# Patient Record
Sex: Female | Born: 1990 | Race: White | Hispanic: No | Marital: Married | Smoking: Never smoker
Health system: Southern US, Community
[De-identification: ages and names within clinical notes are randomized; demographics above are authoritative.]

## PROBLEM LIST (undated history)

## (undated) DIAGNOSIS — Z789 Other specified health status: Secondary | ICD-10-CM

## (undated) DIAGNOSIS — Z309 Encounter for contraceptive management, unspecified: Principal | ICD-10-CM

## (undated) HISTORY — PX: TONSILLECTOMY AND ADENOIDECTOMY: SHX28

## (undated) HISTORY — DX: Encounter for contraceptive management, unspecified: Z30.9

## (undated) HISTORY — DX: Other specified health status: Z78.9

---
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
## 2013-06-25 ENCOUNTER — Encounter: Payer: Self-pay | Admitting: Adult Health

## 2013-06-25 ENCOUNTER — Ambulatory Visit (INDEPENDENT_AMBULATORY_CARE_PROVIDER_SITE_OTHER): Payer: 59 | Admitting: Adult Health

## 2013-06-25 VITALS — BP 130/80 | Ht 66.0 in | Wt 174.0 lb

## 2013-06-25 DIAGNOSIS — Z3049 Encounter for surveillance of other contraceptives: Secondary | ICD-10-CM

## 2013-06-25 DIAGNOSIS — Z3041 Encounter for surveillance of contraceptive pills: Secondary | ICD-10-CM | POA: Insufficient documentation

## 2013-06-25 DIAGNOSIS — Z309 Encounter for contraceptive management, unspecified: Secondary | ICD-10-CM

## 2013-06-25 HISTORY — DX: Encounter for contraceptive management, unspecified: Z30.9

## 2013-06-25 MED ORDER — NORGESTIMATE-ETH ESTRADIOL 0.25-35 MG-MCG PO TABS: 1.0000 | ORAL_TABLET | Freq: Every day | ORAL | Status: DC

## 2013-06-25 NOTE — Patient Instructions (Addendum)
Continue sprintec Pap in February Call prn

## 2013-06-25 NOTE — Progress Notes (Signed)
Subjective:     Patient ID: Emily Carpenter, female   DOB: Feb 23, 1991, 22 y.o.   MRN: [id number]  HPI Emily Carpenter is a 22 year old white female in to refill her pills.She is taking sprintec and has done well, has 3 day periods and no cramps.did have a spot of blood one day when not her period.Going to nursing school.Has had sex no problems.   Review of Systems See HPI Reviewed past medical,surgical, social and family history. Reviewed medications and allergies.      Objective:   Physical Exam BP 130/80  Ht 5\' 6"  (1.676 m)  Wt 174 lb (78.926 kg)  BMI 28.1 kg/m2  LMP 06/04/2013   Talk only, will continue the sprintec Assessment:     Contraceptive management    Plan:     Rx sprintec disp 1 pack take 1 daily with 11 refills Return in 5 months for pap and physical

## 2013-11-21 ENCOUNTER — Other Ambulatory Visit: Payer: 59 | Admitting: Adult Health

## 2013-11-28 ENCOUNTER — Encounter: Payer: Self-pay | Admitting: Adult Health

## 2013-11-28 ENCOUNTER — Encounter (INDEPENDENT_AMBULATORY_CARE_PROVIDER_SITE_OTHER): Payer: Self-pay

## 2013-11-28 ENCOUNTER — Ambulatory Visit (INDEPENDENT_AMBULATORY_CARE_PROVIDER_SITE_OTHER): Payer: 59 | Admitting: Adult Health

## 2013-11-28 ENCOUNTER — Other Ambulatory Visit (HOSPITAL_COMMUNITY)
Admission: RE | Admit: 2013-11-28 | Discharge: 2013-11-28 | Disposition: A | Discharge status: Home / Self Care | Payer: 59 | Source: Ambulatory Visit | Attending: Obstetrics and Gynecology | Admitting: Obstetrics and Gynecology

## 2013-11-28 VITALS — BP 138/78 | HR 72 | Ht 66.0 in | Wt 181.5 lb

## 2013-11-28 DIAGNOSIS — Z01419 Encounter for gynecological examination (general) (routine) without abnormal findings: Secondary | ICD-10-CM | POA: Insufficient documentation

## 2013-11-28 DIAGNOSIS — Z309 Encounter for contraceptive management, unspecified: Secondary | ICD-10-CM

## 2013-11-28 NOTE — Progress Notes (Signed)
Patient ID: Emily DeemMeghan L Carpenter, female   DOB: 11-Feb-1991, 23 y.o.   MRN: [id number] History of Present Illness: Emily SpruceMeghan is a 23 year old white female in for her first pap and physical.No complaints.Happy with her OCs.Mom with pt.   Current Medications, Allergies, Past Medical History, Past Surgical History, Family History and Social History were reviewed in Emily Carpenter electronic medical record.     Review of Systems: Patient denies any headaches, blurred vision, shortness of breath, chest pain, abdominal pain, problems with bowel movements, urination, or intercourse. No joint pain or mood swings, in nursing school.    Physical Exam:BP 138/78  Pulse 72  Ht 5\' 6"  (1.676 m)  Wt 181 lb 8 oz (82.328 kg)  BMI 29.31 kg/m2  LMP 11/19/2013 General:  Well developed, well nourished, no acute distress Skin:  Warm and dry Neck:  Midline trachea, normal thyroid Lungs; Clear to auscultation bilaterally Breast:  No dominant palpable mass, retraction, or nipple discharge Cardiovascular: Regular rate and rhythm Abdomen:  Soft, non tender, no hepatosplenomegaly Pelvic:  External genitalia is normal in appearance.  The vagina is normal in appearance. The cervix is nulliparous, pap performed.Marland Kitchen.  Uterus is felt to be normal size, shape, and contour.  No                adnexal masses or tenderness noted. Extremities:  No swelling or varicosities noted Psych:  No mood changes, alert and cooperative ,seems happy   Impression: Yearly gyn exam-first pap Contraceptive management    Plan: Pap and physical in 2 years Continue sprintec Call prn problems

## 2013-11-28 NOTE — Patient Instructions (Signed)
Physical and pap in 2 years Call prn

## 2014-05-27 ENCOUNTER — Other Ambulatory Visit: Payer: Self-pay | Admitting: Adult Health

## 2014-11-18 ENCOUNTER — Other Ambulatory Visit: Payer: Self-pay | Admitting: Adult Health

## 2014-12-02 ENCOUNTER — Telehealth: Payer: Self-pay | Admitting: *Deleted

## 2014-12-02 NOTE — Telephone Encounter (Signed)
Left message x 1. JSY 

## 2014-12-02 NOTE — Telephone Encounter (Signed)
Left message x 2. JSY 

## 2014-12-03 NOTE — Telephone Encounter (Signed)
Pt Mother, Misty StanleyLisa, informed Sprintec e-scribed to [facility] on 11/19/2014.

## 2015-10-27 ENCOUNTER — Encounter: Payer: Self-pay | Admitting: Adult Health

## 2015-10-27 ENCOUNTER — Other Ambulatory Visit (HOSPITAL_COMMUNITY)
Admission: RE | Admit: 2015-10-27 | Discharge: 2015-10-27 | Disposition: A | Discharge status: Home / Self Care | Payer: PRIVATE HEALTH INSURANCE | Source: Ambulatory Visit | Attending: Obstetrics and Gynecology | Admitting: Obstetrics and Gynecology

## 2015-10-27 ENCOUNTER — Ambulatory Visit (INDEPENDENT_AMBULATORY_CARE_PROVIDER_SITE_OTHER): Payer: PRIVATE HEALTH INSURANCE | Admitting: Adult Health

## 2015-10-27 VITALS — BP 130/60 | HR 82 | Ht 66.25 in | Wt 187.0 lb

## 2015-10-27 DIAGNOSIS — Z01419 Encounter for gynecological examination (general) (routine) without abnormal findings: Secondary | ICD-10-CM | POA: Diagnosis not present

## 2015-10-27 DIAGNOSIS — Z3041 Encounter for surveillance of contraceptive pills: Secondary | ICD-10-CM

## 2015-10-27 MED ORDER — NORGESTIMATE-ETH ESTRADIOL 0.25-35 MG-MCG PO TABS: 1.0000 | ORAL_TABLET | Freq: Every day | ORAL | Status: DC

## 2015-10-27 NOTE — Patient Instructions (Signed)
Physical in 1 year, pap in 3 if normal Continue OCs 

## 2015-10-27 NOTE — Progress Notes (Signed)
Patient ID: Emily Carpenter, female   DOB: 06-29-1991, 25 y.o.   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   Current Medications, Allergies, Past Medical History, Past Surgical History, Family History and Social History were reviewed in [employer] record.     Review of Systems: Patient denies any headaches, hearing loss, fatigue, blurred vision, shortness of breath, chest pain, abdominal pain, problems with bowel movements, urination, or intercourse. No joint pain or mood swings.    Physical Exam: BP 130/60 mmHg  Pulse 82  Ht 5' 6.25" (1.683 m)  Wt 187 lb (84.823 kg)  BMI 29.95 kg/m2  LMP 10/18/2015 General:  Well developed, well nourished, no acute distress Skin:  Warm and dry Neck:  Midline trachea, normal thyroid, good ROM, no lymphadenopathy Lungs; Clear to auscultation bilaterally Breast:  No dominant palpable mass, retraction, or nipple discharge Cardiovascular: Regular rate and rhythm Abdomen:  Soft, non tender, no hepatosplenomegaly Pelvic:  External genitalia is normal in appearance, no lesions.  The vagina is normal in appearance. Urethra has no lesions or masses. The cervix is tiny and everted at os, pap performed.Marland Kitchen  Uterus is felt to be normal size, shape, and contour.  No adnexal masses or tenderness noted.Bladder is non tender, no masses felt. Extremities/musculoskeletal:  No swelling or varicosities noted, no clubbing or cyanosis Psych:  No mood changes, alert and cooperative,seems happy She is not ready to get pregnant, but discussed when decides to, start prenatal vitamins with folic acid and be off OCs for 3 months to tract cycle and it could take 6-18 months of trying.  Impression: Well woman gyn exam and pap Contraceptive management   Plan: Refilled sprintec for 1  year Physical in 1 year, pap in 3 if negative

## 2015-10-28 LAB — CYTOLOGY - PAP

## 2016-09-16 ENCOUNTER — Other Ambulatory Visit: Payer: Self-pay | Admitting: Adult Health

## 2016-10-07 ENCOUNTER — Telehealth: Payer: Self-pay | Admitting: Adult Health

## 2016-10-11 MED ORDER — NORGESTIMATE-ETH ESTRADIOL 0.25-35 MG-MCG PO TABS: 1.0000 | ORAL_TABLET | Freq: Every day | ORAL | 3 refills | Status: DC

## 2016-10-11 NOTE — Telephone Encounter (Signed)
Refill sent.

## 2016-10-28 ENCOUNTER — Encounter: Payer: Self-pay | Admitting: Adult Health

## 2016-10-28 ENCOUNTER — Ambulatory Visit (INDEPENDENT_AMBULATORY_CARE_PROVIDER_SITE_OTHER): Payer: PRIVATE HEALTH INSURANCE | Admitting: Adult Health

## 2016-10-28 VITALS — BP 130/62 | HR 72 | Ht 66.25 in | Wt 210.0 lb

## 2016-10-28 DIAGNOSIS — Z01419 Encounter for gynecological examination (general) (routine) without abnormal findings: Secondary | ICD-10-CM

## 2016-10-28 DIAGNOSIS — Z3041 Encounter for surveillance of contraceptive pills: Secondary | ICD-10-CM

## 2016-10-28 MED ORDER — NORGESTIMATE-ETH ESTRADIOL 0.25-35 MG-MCG PO TABS: 1.0000 | ORAL_TABLET | Freq: Every day | ORAL | 12 refills | Status: DC

## 2016-10-28 NOTE — Progress Notes (Signed)
Patient ID: Emily Carpenter L Schoonmaker, female   DOB: 09/30/1991, 26 y.o.   MRN: [id number] History of Present Illness:  Lindie SpruceMeghan is a 26 year old white female in for well woman gyn exam,she had normal pap 10/27/15. [facility].   Current Medications, Allergies, Past Medical History, Past Surgical History, Family History and Social History were reviewed in [facility] electronic medical record.     Review of Systems:  Patient denies any headaches, hearing loss, fatigue, blurred vision, shortness of breath, chest pain, abdominal pain, problems with bowel movements, urination, or intercourse. No joint pain or mood swings.She is happy with her OCs.   Physical Exam:BP 130/62 (BP Location: Left Arm, Patient Position: Sitting, Cuff Size: Large)   Pulse 72   Ht 5' 6.25" (1.683 m)   Wt 210 lb (95.3 kg)   LMP 10/14/2016 (Approximate)   BMI 33.64 kg/m  General:  Well developed, well nourished, no acute distress Skin:  Warm and dry Neck:  Midline trachea, normal thyroid, good ROM, no lymphadenopathy Lungs; Clear to auscultation bilaterally Breast:  No dominant palpable mass, retraction, or nipple discharge Cardiovascular: Regular rate and rhythm Abdomen:  Soft, non tender, no hepatosplenomegaly Pelvic:  External genitalia is normal in appearance, no lesions.  The vagina is normal in appearance. Urethra has no lesions or masses. The cervix is nulliparous.  Uterus is felt to be normal size, shape, and contour.  No adnexal masses or tenderness noted.Bladder is non tender, no masses felt. Extremities/musculoskeletal:  No swelling or varicosities noted, no clubbing or cyanosis Psych:  No mood changes, alert and cooperative,seems happy PHQ 2 score 0.  Impression:  1. Well woman exam with routine gynecological exam   2. Encounter for surveillance of contraceptive pills      Plan: Meds ordered this encounter  Medications  . norgestimate-ethinyl estradiol (SPRINTEC 28) 0.25-35 MG-MCG tablet   Sig: Take 1 tablet by mouth daily.    Dispense:  28 tablet    Refill:  12    Order Specific Question:   Supervising Provider    Answer:   Lazaro ArmsEURE, LUTHER H [[id number]]  Physical in 1 year, pap in 2020

## 2017-10-31 ENCOUNTER — Other Ambulatory Visit (HOSPITAL_COMMUNITY)
Admission: RE | Admit: 2017-10-31 | Discharge: 2017-10-31 | Disposition: A | Discharge status: Home / Self Care | Payer: Commercial Managed Care - PPO | Source: Ambulatory Visit | Attending: Adult Health | Admitting: Adult Health

## 2017-10-31 ENCOUNTER — Ambulatory Visit (INDEPENDENT_AMBULATORY_CARE_PROVIDER_SITE_OTHER): Payer: Commercial Managed Care - PPO | Admitting: Adult Health

## 2017-10-31 ENCOUNTER — Encounter: Payer: Self-pay | Admitting: Adult Health

## 2017-10-31 VITALS — BP 138/70 | HR 85 | Ht 66.5 in | Wt 219.0 lb

## 2017-10-31 DIAGNOSIS — Z3041 Encounter for surveillance of contraceptive pills: Secondary | ICD-10-CM | POA: Diagnosis not present

## 2017-10-31 DIAGNOSIS — Z319 Encounter for procreative management, unspecified: Secondary | ICD-10-CM | POA: Diagnosis not present

## 2017-10-31 DIAGNOSIS — Z01419 Encounter for gynecological examination (general) (routine) without abnormal findings: Secondary | ICD-10-CM | POA: Diagnosis not present

## 2017-10-31 MED ORDER — OB COMPLETE PETITE 35-5-1-200 MG PO CAPS: ORAL_CAPSULE | ORAL | 12 refills | Status: DC

## 2017-10-31 NOTE — Patient Instructions (Signed)
Preparing for Pregnancy If you are considering becoming pregnant, make an appointment to see your regular health care provider to learn how to prepare for a safe and healthy pregnancy (preconception care). During a preconception care visit, your health care provider will:  Do a complete physical exam, including a Pap test.  Take a complete medical history.  Give you information, answer your questions, and help you resolve problems.  Preconception checklist Medical history  Tell your health care provider about any current or past medical conditions. Your pregnancy or your ability to become pregnant may be affected by chronic conditions, such as diabetes, chronic hypertension, and thyroid problems.  Include your family's medical history as well as your partner's medical history.  Tell your health care provider about any history of STIs (sexually transmitted infections).These can affect your pregnancy. In some cases, they can be passed to your baby. Discuss any concerns that you have about STIs.  If indicated, discuss the benefits of genetic testing. This testing will show whether there are any genetic conditions that may be passed from you or your partner to your baby.  Tell your health care provider about: ? Any problems you have had with conception or pregnancy. ? Any medicines you take. These include vitamins, herbal supplements, and over-the-counter medicines. ? Your history of immunizations. Discuss any vaccinations that you may need.  Diet  Ask your health care provider what to include in a healthy diet that has a balance of nutrients. This is especially important when you are pregnant or preparing to become pregnant.  Ask your health care provider to help you reach a healthy weight before pregnancy. ? If you are overweight, you may be at higher risk for certain complications, such as high blood pressure, diabetes, and preterm birth. ? If you are underweight, you are more likely  to have a baby who has a low birth weight.  Lifestyle, work, and home  Let your health care provider know: ? About any lifestyle habits that you have, such as alcohol use, drug use, or smoking. ? About recreational activities that may put you at risk during pregnancy, such as downhill skiing and certain exercise programs. ? Tell your health care provider about any international travel, especially any travel to places with an active Zika virus outbreak. ? About harmful substances that you may be exposed to at work or at home. These include chemicals, pesticides, radiation, or even litter boxes. ? If you do not feel safe at home.  Mental health  Tell your health care provider about: ? Any history of mental health conditions, including feelings of depression, sadness, or anxiety. ? Any medicines that you take for a mental health condition. These include herbs and supplements.  Home instructions to prepare for pregnancy Lifestyle  Eat a balanced diet. This includes fresh fruits and vegetables, whole grains, lean meats, low-fat dairy products, healthy fats, and foods that are high in fiber. Ask to meet with a nutritionist or registered dietitian for assistance with meal planning and goals.  Get regular exercise. Try to be active for at least 30 minutes a day on most days of the week. Ask your health care provider which activities are safe during pregnancy.  Do not use any products that contain nicotine or tobacco, such as cigarettes and e-cigarettes. If you need help quitting, ask your health care provider.  Do not drink alcohol.  Do not take illegal drugs.  Maintain a healthy weight. Ask your health care provider what weight range is   right for you.  General instructions  Keep an accurate record of your menstrual periods. This makes it easier for your health care provider to determine your baby's due date.  Begin taking prenatal vitamins and folic acid supplements daily as directed by  your health care provider.  Manage any chronic conditions, such as high blood pressure and diabetes, as told by your health care provider. This is important.  How do I know that I am pregnant? You may be pregnant if you have been sexually active and you miss your period. Symptoms of early pregnancy include:  Mild cramping.  Very light vaginal bleeding (spotting).  Feeling unusually tired.  Nausea and vomiting (morning sickness).  If you have any of these symptoms and you suspect that you might be pregnant, you can take a home pregnancy test. These tests check for a hormone in your urine (human chorionic gonadotropin, or hCG). A woman's body begins to make this hormone during early pregnancy. These tests are very accurate. Wait until at least the first day after you miss your period to take one. If the test shows that you are pregnant (you get a positive result), call your health care provider to make an appointment for prenatal care. What should I do if I become pregnant?  Make an appointment with your health care provider as soon as you suspect you are pregnant.  Do not use any products that contain nicotine, such as cigarettes, chewing tobacco, and e-cigarettes. If you need help quitting, ask your health care provider.  Do not drink alcoholic beverages. Alcohol is related to a number of birth defects.  Avoid toxic odors and chemicals.  You may continue to have sexual intercourse if it does not cause pain or other problems, such as vaginal bleeding. This information is not intended to replace advice given to you by your health care provider. Make sure you discuss any questions you have with your health care provider. Document Released: 09/02/2008 Document Revised: 05/18/2016 Document Reviewed: 04/11/2016 Elsevier Interactive Patient Education  2018 Elsevier Inc.  

## 2017-10-31 NOTE — Progress Notes (Signed)
Patient ID: Emily RinkMeghan L Carpenter, female   DOB: 01/29/1991, 27 y.o.   MRN: [id number] History of Present Illness:  Emily Carpenter is a 27 year old white female, married in for well woman gyn exam and pap.She works at [employer].  [facility]   Current Medications, Allergies, Past Medical History, Past Surgical History, Family History and Social History were reviewed in [facility] electronic medical record.     Review of Systems: Patient denies any headaches, hearing loss, fatigue, blurred vision, shortness of breath, chest pain, abdominal pain, problems with bowel movements, urination, or intercourse. No joint pain or mood swings. Thinking of getting pregnant in near future. She has lost 12 lbs and would like to be below 200, when gets pregnant.     Physical Exam:BP 138/70 (BP Location: Left Arm, Patient Position: Sitting, Cuff Size: Large)   Pulse 85   Ht 5' 6.5" (1.689 m)   Wt 219 lb (99.3 kg)   LMP 10/14/2017   BMI 34.82 kg/m  General:  Well developed, well nourished, no acute distress Skin:  Warm and dry Neck:  Midline trachea, normal thyroid, good ROM, no lymphadenopathy Lungs; Clear to auscultation bilaterally Breast:  No dominant palpable mass, retraction, or nipple discharge Cardiovascular: Regular rate and rhythm Abdomen:  Soft, non tender, no hepatosplenomegaly Pelvic:  External genitalia is normal in appearance, no lesions.  The vagina is normal in appearance. Urethra has no lesions or masses. The cervix is smooth,thin prep pap with reflex HPV performed.  Uterus is felt to be normal size, shape, and contour.  No adnexal masses or tenderness noted.Bladder is non tender, no masses felt. Extremities/musculoskeletal:  No swelling or varicosities noted, no clubbing or cyanosis Psych:  No mood changes, alert and cooperative,seems happy PHQ 2 score 0. She may stop OCs in March, discussed timing of sex and to pee before and lay there 30 minutes after.Will rx PNV now.    Impression: 1. Encounter for gynecological examination with Papanicolaou smear of cervix   2. Encounter for surveillance of contraceptive pills   3. Patient desires pregnancy       Plan: Meds ordered this encounter  Medications  . Prenat-FeCbn-FeAspGl-FA-Omega (OB COMPLETE PETITE) 35-5-1-200 MG CAPS    Sig: Take daily    Dispense:  30 capsule    Refill:  12    Order Specific Question:   Supervising Provider    Answer:   Duane LopeEURE, LUTHER H [[id number]]  Continue OCs til ready  Physical in 1 year Pap in 3 if normal

## 2017-11-04 LAB — CYTOLOGY - PAP: DIAGNOSIS: NEGATIVE

## 2017-11-07 ENCOUNTER — Telehealth: Payer: Self-pay | Admitting: *Deleted

## 2017-11-07 NOTE — Telephone Encounter (Signed)
Advised patient to try OTC gummie vitamins or Flintstones since insurance will not cover. Pt verbalized understanding.

## 2018-02-06 ENCOUNTER — Telehealth: Payer: Self-pay | Admitting: Obstetrics & Gynecology

## 2018-02-06 NOTE — Telephone Encounter (Signed)
Spoke with pt. Pt reports having spotting over the weekend and it has gotten worse. + cramping, pain in right side. Cramping has eased up some. No appts available here. Advised pt to see how she does. If pain gets worse or bleeding heavier, go to [facility] for eval. Pt voiced understanding. JSY

## 2018-02-21 ENCOUNTER — Ambulatory Visit: Payer: Commercial Managed Care - PPO | Admitting: Adult Health

## 2018-02-21 ENCOUNTER — Encounter: Payer: Self-pay | Admitting: Adult Health

## 2018-02-21 VITALS — BP 120/60 | HR 97 | Ht 66.2 in | Wt 218.0 lb

## 2018-02-21 DIAGNOSIS — Z3202 Encounter for pregnancy test, result negative: Secondary | ICD-10-CM | POA: Diagnosis not present

## 2018-02-21 DIAGNOSIS — R10813 Right lower quadrant abdominal tenderness: Secondary | ICD-10-CM

## 2018-02-21 DIAGNOSIS — N926 Irregular menstruation, unspecified: Secondary | ICD-10-CM | POA: Diagnosis not present

## 2018-02-21 LAB — POCT URINE PREGNANCY: PREG TEST UR: NEGATIVE

## 2018-02-21 NOTE — Progress Notes (Signed)
  Subjective:     Patient ID: Emily Carpenter, female   DOB: 23-Jan-1991, 27 y.o.   MRN: [id number]  HPI Emily Carpenter is a 27 year old white female, married in for UPT, has missed a period and had 3+HPTs, then had bleeding for last 1.5 weeks and cramps and passed tissue and now pain RLQ, no bleeding now.   Review of Systems +missed period +3HPTs, then had bleeding and passed tissue +RLQ pain Reviewed past medical,surgical, social and family history. Reviewed medications and allergies.     Objective:   Physical Exam BP 120/60 (BP Location: Left Arm, Patient Position: Sitting, Cuff Size: Large)   Pulse 97   Ht 5' 6.2" (1.681 m)   Wt 218 lb (98.9 kg)   LMP 01/05/2018   BMI 34.97 kg/m  UPT negative. Skin warm and dry.Pelvic: external genitalia is normal in appearance no lesions, vagina:scant discharge without odor,urethra has no lesions or masses noted, cervix:smooth, uterus: normal size, shape and contour, non tender, no masses felt, adnexa: no masses,+ mild RLQ tenderness noted,no rebound. Bladder is non tender and no masses felt.Will check labs if [facility] elevated, may get [place].    Assessment:     1. Missed period   2. Right lower quadrant abdominal tenderness without rebound tenderness   3. Pregnancy examination or test, negative result       Plan:     Check QHCG, CBC and ABORH Will talk in am

## 2018-02-22 ENCOUNTER — Telehealth: Payer: Self-pay | Admitting: Adult Health

## 2018-02-22 LAB — ABO/RH: Rh Factor: POSITIVE

## 2018-02-22 LAB — CBC
Hematocrit: 40 % (ref 34.0–46.6)
Hemoglobin: 13.4 g/dL (ref 11.1–15.9)
MCH: 30 pg (ref 26.6–33.0)
MCHC: 33.5 g/dL (ref 31.5–35.7)
MCV: 90 fL (ref 79–97)
PLATELETS: 259 10*3/uL (ref 150–450)
RBC: 4.47 x10E6/uL (ref 3.77–5.28)
RDW: 13.3 % (ref 12.3–15.4)
WBC: 9.4 10*3/uL (ref 3.4–10.8)

## 2018-02-22 LAB — BETA HCG QUANT (REF LAB)

## 2018-02-22 NOTE — Telephone Encounter (Signed)
Patient called stating that she had Blood work done yesterday and would like to know the results. Please contact pt

## 2018-02-22 NOTE — Telephone Encounter (Signed)
Pt aware QHCG <1 and blodd type O+, and she says RLQ pain is much better to day. Ok to resume sexual activity

## 2018-04-19 ENCOUNTER — Encounter: Payer: Self-pay | Admitting: Adult Health

## 2018-04-19 ENCOUNTER — Ambulatory Visit: Payer: Commercial Managed Care - PPO | Admitting: Adult Health

## 2018-04-19 VITALS — BP 118/69 | HR 83 | Ht 66.5 in | Wt 222.0 lb

## 2018-04-19 DIAGNOSIS — R110 Nausea: Secondary | ICD-10-CM

## 2018-04-19 DIAGNOSIS — Z3A01 Less than 8 weeks gestation of pregnancy: Secondary | ICD-10-CM | POA: Insufficient documentation

## 2018-04-19 DIAGNOSIS — Z3201 Encounter for pregnancy test, result positive: Secondary | ICD-10-CM | POA: Diagnosis not present

## 2018-04-19 DIAGNOSIS — N926 Irregular menstruation, unspecified: Secondary | ICD-10-CM | POA: Diagnosis not present

## 2018-04-19 DIAGNOSIS — O3680X0 Pregnancy with inconclusive fetal viability, not applicable or unspecified: Secondary | ICD-10-CM | POA: Insufficient documentation

## 2018-04-19 LAB — POCT URINE PREGNANCY: PREG TEST UR: POSITIVE — AB

## 2018-04-19 NOTE — Progress Notes (Signed)
  Subjective:     Patient ID: Emily Carpenter, female   DOB: 1991/10/02, 27 y.o.   MRN: [id number]  HPI Emily Carpenter is a 27 year old white female, married in for UPT.Has missed a period and had 2=HPTs.   Review of Systems +missed period with 2+HPTs +nasuea Reviewed past medical,surgical, social and family history. Reviewed medications and allergies.     Objective:   Physical Exam BP 118/69 (BP Location: Left Arm, Patient Position: Sitting, Cuff Size: Normal)   Pulse 83   Ht 5' 6.5" (1.689 m)   Wt 222 lb (100.7 kg)   LMP 03/04/2018 (Exact Date)   BMI 35.29 kg/m UPT +, about 6+4 weeks by LMP with EDD 12/09/18.Skin warm and dry. Neck: mid line trachea, normal thyroid, good ROM, no lymphadenopathy noted. Lungs: clear to ausculation bilaterally. Cardiovascular: regular rate and rhythm.Abdomen is soft and non tender.    Assessment:     1. Pregnancy test positive   2. Less than [redacted] weeks gestation of pregnancy   3. Encounter to determine fetal viability of pregnancy, single or unspecified fetus       Plan:     Continue PNV Dating US scheduled at [facility] 7/25 at 1:30 pm  Return in 3 weeks for New OB Review handout on First trimester and by [facility]  Eat often

## 2018-04-19 NOTE — Patient Instructions (Signed)
First Trimester of Pregnancy The first trimester of pregnancy is from week 1 until the end of week 13 (months 1 through 3). A week after a sperm fertilizes an egg, the egg will implant on the wall of the uterus. This embryo will begin to develop into a baby. Genes from you and your partner will form the baby. The female genes will determine whether the baby will be a boy or a girl. At 6-8 weeks, the eyes and face will be formed, and the heartbeat can be seen on ultrasound. At the end of 12 weeks, all the baby's organs will be formed. Now that you are pregnant, you will want to do everything you can to have a healthy baby. Two of the most important things are to get good prenatal care and to follow your health care provider's instructions. Prenatal care is all the medical care you receive before the baby's birth. This care will help prevent, find, and treat any problems during the pregnancy and childbirth. Body changes during your first trimester Your body goes through many changes during pregnancy. The changes vary from woman to woman.  You may gain or lose a couple of pounds at first.  You may feel sick to your stomach (nauseous) and you may throw up (vomit). If the vomiting is uncontrollable, call your health care provider.  You may tire easily.  You may develop headaches that can be relieved by medicines. All medicines should be approved by your health care provider.  You may urinate more often. Painful urination may mean you have a bladder infection.  You may develop heartburn as a result of your pregnancy.  You may develop constipation because certain hormones are causing the muscles that push stool through your intestines to slow down.  You may develop hemorrhoids or swollen veins (varicose veins).  Your breasts may begin to grow larger and become tender. Your nipples may stick out more, and the tissue that surrounds them (areola) may become darker.  Your gums may bleed and may be  sensitive to brushing and flossing.  Dark spots or blotches (chloasma, mask of pregnancy) may develop on your face. This will likely fade after the baby is born.  Your menstrual periods will stop.  You may have a loss of appetite.  You may develop cravings for certain kinds of food.  You may have changes in your emotions from day to day, such as being excited to be pregnant or being concerned that something may go wrong with the pregnancy and baby.  You may have more vivid and strange dreams.  You may have changes in your hair. These can include thickening of your hair, rapid growth, and changes in texture. Some women also have hair loss during or after pregnancy, or hair that feels dry or thin. Your hair will most likely return to normal after your baby is born.  What to expect at prenatal visits During a routine prenatal visit:  You will be weighed to make sure you and the baby are growing normally.  Your blood pressure will be taken.  Your abdomen will be measured to track your baby's growth.  The fetal heartbeat will be listened to between weeks 10 and 14 of your pregnancy.  Test results from any previous visits will be discussed.  Your health care provider may ask you:  How you are feeling.  If you are feeling the baby move.  If you have had any abnormal symptoms, such as leaking fluid, bleeding, severe headaches,   or abdominal cramping.  If you are using any tobacco products, including cigarettes, chewing tobacco, and electronic cigarettes.  If you have any questions.  Other tests that may be performed during your first trimester include:  Blood tests to find your blood type and to check for the presence of any previous infections. The tests will also be used to check for low iron levels (anemia) and protein on red blood cells (Rh antibodies). Depending on your risk factors, or if you previously had diabetes during pregnancy, you may have tests to check for high blood  sugar that affects pregnant women (gestational diabetes).  Urine tests to check for infections, diabetes, or protein in the urine.  An ultrasound to confirm the proper growth and development of the baby.  Fetal screens for spinal cord problems (spina bifida) and Down syndrome.  HIV (human immunodeficiency virus) testing. Routine prenatal testing includes screening for HIV, unless you choose not to have this test.  You may need other tests to make sure you and the baby are doing well.  Follow these instructions at home: Medicines  Follow your health care provider's instructions regarding medicine use. Specific medicines may be either safe or unsafe to take during pregnancy.  Take a prenatal vitamin that contains at least 600 micrograms (mcg) of folic acid.  If you develop constipation, try taking a stool softener if your health care provider approves. Eating and drinking  Eat a balanced diet that includes fresh fruits and vegetables, whole grains, good sources of protein such as meat, eggs, or tofu, and low-fat dairy. Your health care provider will help you determine the amount of weight gain that is right for you.  Avoid raw meat and uncooked cheese. These carry germs that can cause birth defects in the baby.  Eating four or five small meals rather than three large meals a day may help relieve nausea and vomiting. If you start to feel nauseous, eating a few soda crackers can be helpful. Drinking liquids between meals, instead of during meals, also seems to help ease nausea and vomiting.  Limit foods that are high in fat and processed sugars, such as fried and sweet foods.  To prevent constipation: ? Eat foods that are high in fiber, such as fresh fruits and vegetables, whole grains, and beans. ? Drink enough fluid to keep your urine clear or pale yellow. Activity  Exercise only as directed by your health care provider. Most women can continue their usual exercise routine during  pregnancy. Try to exercise for 30 minutes at least 5 days a week. Exercising will help you: ? Control your weight. ? Stay in shape. ? Be prepared for labor and delivery.  Experiencing pain or cramping in the lower abdomen or lower back is a good sign that you should stop exercising. Check with your health care provider before continuing with normal exercises.  Try to avoid standing for long periods of time. Move your legs often if you must stand in one place for a long time.  Avoid heavy lifting.  Wear low-heeled shoes and practice good posture.  You may continue to have sex unless your health care provider tells you not to. Relieving pain and discomfort  Wear a good support bra to relieve breast tenderness.  Take warm sitz baths to soothe any pain or discomfort caused by hemorrhoids. Use hemorrhoid cream if your health care provider approves.  Rest with your legs elevated if you have leg cramps or low back pain.  If you develop   varicose veins in your legs, wear support hose. Elevate your feet for 15 minutes, 3-4 times a day. Limit salt in your diet. Prenatal care  Schedule your prenatal visits by the twelfth week of pregnancy. They are usually scheduled monthly at first, then more often in the last 2 months before delivery.  Write down your questions. Take them to your prenatal visits.  Keep all your prenatal visits as told by your health care provider. This is important. Safety  Wear your seat belt at all times when driving.  Make a list of emergency phone numbers, including numbers for family, friends, the hospital, and police and fire departments. General instructions  Ask your health care provider for a referral to a local prenatal education class. Begin classes no later than the beginning of month 6 of your pregnancy.  Ask for help if you have counseling or nutritional needs during pregnancy. Your health care provider can offer advice or refer you to specialists for help  with various needs.  Do not use hot tubs, steam rooms, or saunas.  Do not douche or use tampons or scented sanitary pads.  Do not cross your legs for long periods of time.  Avoid cat litter boxes and soil used by cats. These carry germs that can cause birth defects in the baby and possibly loss of the fetus by miscarriage or stillbirth.  Avoid all smoking, herbs, alcohol, and medicines not prescribed by your health care provider. Chemicals in these products affect the formation and growth of the baby.  Do not use any products that contain nicotine or tobacco, such as cigarettes and e-cigarettes. If you need help quitting, ask your health care provider. You may receive counseling support and other resources to help you quit.  Schedule a dentist appointment. At home, brush your teeth with a soft toothbrush and be gentle when you floss. Contact a health care provider if:  You have dizziness.  You have mild pelvic cramps, pelvic pressure, or nagging pain in the abdominal area.  You have persistent nausea, vomiting, or diarrhea.  You have a bad smelling vaginal discharge.  You have pain when you urinate.  You notice increased swelling in your face, hands, legs, or ankles.  You are exposed to fifth disease or chickenpox.  You are exposed to German measles (rubella) and have never had it. Get help right away if:  You have a fever.  You are leaking fluid from your vagina.  You have spotting or bleeding from your vagina.  You have severe abdominal cramping or pain.  You have rapid weight gain or loss.  You vomit blood or material that looks like coffee grounds.  You develop a severe headache.  You have shortness of breath.  You have any kind of trauma, such as from a fall or a car accident. Summary  The first trimester of pregnancy is from week 1 until the end of week 13 (months 1 through 3).  Your body goes through many changes during pregnancy. The changes vary from  woman to woman.  You will have routine prenatal visits. During those visits, your health care provider will examine you, discuss any test results you may have, and talk with you about how you are feeling. This information is not intended to replace advice given to you by your health care provider. Make sure you discuss any questions you have with your health care provider. Document Released: 09/14/2001 Document Revised: 09/01/2016 Document Reviewed: 09/01/2016 Elsevier Interactive Patient Education  2018 [employer]  

## 2018-04-27 ENCOUNTER — Telehealth: Payer: Self-pay | Admitting: Adult Health

## 2018-04-27 ENCOUNTER — Ambulatory Visit (HOSPITAL_COMMUNITY)
Admission: RE | Admit: 2018-04-27 | Discharge: 2018-04-27 | Disposition: A | Discharge status: Home / Self Care | Payer: Commercial Managed Care - PPO | Source: Ambulatory Visit | Attending: Adult Health | Admitting: Adult Health

## 2018-04-27 DIAGNOSIS — O3680X0 Pregnancy with inconclusive fetal viability, not applicable or unspecified: Secondary | ICD-10-CM | POA: Diagnosis present

## 2018-04-27 DIAGNOSIS — Z3A08 8 weeks gestation of pregnancy: Secondary | ICD-10-CM | POA: Diagnosis not present

## 2018-04-27 NOTE — Telephone Encounter (Signed)
Pt aware of U/S

## 2018-05-10 ENCOUNTER — Other Ambulatory Visit: Payer: Self-pay

## 2018-05-10 ENCOUNTER — Ambulatory Visit (INDEPENDENT_AMBULATORY_CARE_PROVIDER_SITE_OTHER): Payer: Commercial Managed Care - PPO | Admitting: Women's Health

## 2018-05-10 ENCOUNTER — Encounter: Payer: Self-pay | Admitting: Women's Health

## 2018-05-10 VITALS — BP 133/72 | HR 87 | Wt 224.0 lb

## 2018-05-10 DIAGNOSIS — Z3481 Encounter for supervision of other normal pregnancy, first trimester: Secondary | ICD-10-CM

## 2018-05-10 DIAGNOSIS — Z1389 Encounter for screening for other disorder: Secondary | ICD-10-CM

## 2018-05-10 DIAGNOSIS — Z3A09 9 weeks gestation of pregnancy: Secondary | ICD-10-CM

## 2018-05-10 DIAGNOSIS — Z3682 Encounter for antenatal screening for nuchal translucency: Secondary | ICD-10-CM

## 2018-05-10 DIAGNOSIS — Z331 Pregnant state, incidental: Secondary | ICD-10-CM

## 2018-05-10 DIAGNOSIS — O0990 Supervision of high risk pregnancy, unspecified, unspecified trimester: Secondary | ICD-10-CM | POA: Insufficient documentation

## 2018-05-10 LAB — POCT URINALYSIS DIPSTICK OB
Blood, UA: NEGATIVE
Glucose, UA: NEGATIVE — AB
KETONES UA: NEGATIVE
LEUKOCYTES UA: NEGATIVE
NITRITE UA: NEGATIVE

## 2018-05-10 NOTE — Progress Notes (Signed)
INITIAL OBSTETRICAL VISIT Patient name: Emily Carpenter MRN [id number]  Date of birth: Aug 18, 1991 Chief Complaint:   Initial Prenatal Visit  History of Present Illness:   Emily Carpenter is a 27 y.o. G60P0010 Caucasian female at [redacted]w[redacted]d by LMP c/w 8wk u/s, with an Estimated Date of Delivery: 12/09/18 being seen today for her initial obstetrical visit.   Her obstetrical history is significant for SAB x 1.   Today she reports had n/v/d yesterday, much better today.  Patient's last menstrual period was 03/04/2018 (exact date). Last pap 10/31/17. Results were: normal Review of Systems:   Pertinent items are noted in HPI Denies cramping/contractions, leakage of fluid, vaginal bleeding, abnormal vaginal discharge w/ itching/odor/irritation, headaches, visual changes, shortness of breath, chest pain, abdominal pain, severe nausea/vomiting, or problems with urination or bowel movements unless otherwise stated above.  Pertinent History Reviewed:  Reviewed past medical,surgical, social, obstetrical and family history.  Reviewed problem list, medications and allergies. OB History  Gravida Para Term Preterm AB Living  2 0     1 0  SAB TAB Ectopic Multiple Live Births  1            # Outcome Date GA Lbr Len/2nd Weight Sex Delivery Anes PTL Lv  2 Current           1 SAB 02/07/18 [redacted]w[redacted]d          Physical Assessment:   Vitals:   05/10/18 0935  BP: 133/72  Pulse: 87  Weight: 224 lb (101.6 kg)  Body mass index is 35.61 kg/m.       Physical Examination:  General appearance - well appearing, and in no distress  Mental status - alert, oriented to person, place, and time  Psych:  She has a normal mood and affect  Skin - warm and dry, normal color, no suspicious lesions noted  Chest - effort normal, all lung fields clear to auscultation bilaterally  Heart - normal rate and regular rhythm  Abdomen - soft, nontender  Extremities:  No swelling or varicosities noted  Thin prep pap is not done   Fetal  Heart Rate (bpm): +u/s via informal transabdominal u/s, +active fetus  Results for orders placed or performed in visit on 05/10/18 (from the past 24 hour(s))  POC Urinalysis Dipstick OB   Collection Time: 05/10/18  9:45 AM  Result Value Ref Range   Color, UA     Clarity, UA     Glucose, UA Negative (A) (none)   Bilirubin, UA     Ketones, UA neg    Spec Grav, UA  1.010 - 1.025   Blood, UA neg    pH, UA  5.0 - 8.0   POC Protein UA Trace Negative, Trace   Urobilinogen, UA  0.2 or 1.0 E.U./dL   Nitrite, UA neg    Leukocytes, UA Negative Negative   Appearance     Odor      Assessment & Plan:  1) Low-Risk Pregnancy G2P0010 at [redacted]w[redacted]d with an Estimated Date of Delivery: 12/09/18   2) Initial OB visit  Meds: No orders of the defined types were placed in this encounter.   Initial labs obtained Continue prenatal vitamins Reviewed n/v relief measures and warning s/s to report Reviewed recommended weight gain based on pre-gravid BMI Encouraged well-balanced diet Genetic Screening discussed Integrated Screen: requested Cystic fibrosis screening discussed declined Ultrasound discussed; fetal survey: requested CCNC completed>not applying for preg [facility]  Follow-up: Return in about 1 month (around 06/07/2018) for  LROB, US:NT+1stIT.   Orders Placed This Encounter  Procedures  . Urine Culture  . GC/Chlamydia Probe Amp  . US Fetal Nuchal Translucency Measurement  . Obstetric Panel, Including HIV  . Urinalysis, Routine w reflex microscopic  . Pain Management Screening Profile (10S)  . POC Urinalysis Dipstick OB    Cheral MarkerKimberly R Milledge Gerding CNM, [facility] CenterWHNP-BC 05/10/2018 10:36 AM

## 2018-05-10 NOTE — Patient Instructions (Signed)
Emily Carpenter, I greatly value your feedback.  If you receive a survey following your visit with us today, we appreciate you taking the time to fill it out.  Thanks, Joellyn HaffKim Haileyann Staiger, CNM, WHNP-BC   Nausea & Vomiting  Have saltine crackers or pretzels by your bed and eat a few bites before you raise your head out of bed in the morning  Eat small frequent meals throughout the day instead of large meals  Drink plenty of fluids throughout the day to stay hydrated, just don't drink a lot of fluids with your meals.  This can make your stomach fill up faster making you feel sick  Do not brush your teeth right after you eat  Products with real ginger are good for nausea, like ginger ale and ginger hard candy Make sure it says made with real ginger!  Sucking on sour candy like lemon heads is also good for nausea  If your prenatal vitamins make you nauseated, take them at night so you will sleep through the nausea  Sea Bands  If you feel like you need medicine for the nausea & vomiting please let us know  If you are unable to keep any fluids or food down please let us know   Constipation  Drink plenty of fluid, preferably water, throughout the day  Eat foods high in fiber such as fruits, vegetables, and grains  Exercise, such as walking, is a good way to keep your bowels regular  Drink warm fluids, especially warm prune juice, or decaf coffee  Eat a 1/2 cup of real oatmeal (not instant), 1/2 cup applesauce, and 1/2-1 cup warm prune juice every day  If needed, you may take Colace (docusate sodium) stool softener once or twice a day to help keep the stool soft. If you are pregnant, wait until you are out of your first trimester (12-14 weeks of pregnancy)  If you still are having problems with constipation, you may take Miralax once daily as needed to help keep your bowels regular.  If you are pregnant, wait until you are out of your first trimester (12-14 weeks of pregnancy)   First  Trimester of Pregnancy The first trimester of pregnancy is from week 1 until the end of week 12 (months 1 through 3). A week after a sperm fertilizes an egg, the egg will implant on the wall of the uterus. This embryo will begin to develop into a baby. Genes from you and your partner are forming the baby. The female genes determine whether the baby is a boy or a girl. At 6-8 weeks, the eyes and face are formed, and the heartbeat can be seen on ultrasound. At the end of 12 weeks, all the baby's organs are formed.  Now that you are pregnant, you will want to do everything you can to have a healthy baby. Two of the most important things are to get good prenatal care and to follow your health care provider's instructions. Prenatal care is all the medical care you receive before the baby's birth. This care will help prevent, find, and treat any problems during the pregnancy and childbirth. BODY CHANGES Your body goes through many changes during pregnancy. The changes vary from woman to woman.   You may gain or lose a couple of pounds at first.  You may feel sick to your stomach (nauseous) and throw up (vomit). If the vomiting is uncontrollable, call your health care provider.  You may tire easily.  You may develop headaches  that can be relieved by medicines approved by your health care provider.  You may urinate more often. Painful urination may mean you have a bladder infection.  You may develop heartburn as a result of your pregnancy.  You may develop constipation because certain hormones are causing the muscles that push waste through your intestines to slow down.  You may develop hemorrhoids or swollen, bulging veins (varicose veins).  Your breasts may begin to grow larger and become tender. Your nipples may stick out more, and the tissue that surrounds them (areola) may become darker.  Your gums may bleed and may be sensitive to brushing and flossing.  Dark spots or blotches (chloasma, mask  of pregnancy) may develop on your face. This will likely fade after the baby is born.  Your menstrual periods will stop.  You may have a loss of appetite.  You may develop cravings for certain kinds of food.  You may have changes in your emotions from day to day, such as being excited to be pregnant or being concerned that something may go wrong with the pregnancy and baby.  You may have more vivid and strange dreams.  You may have changes in your hair. These can include thickening of your hair, rapid growth, and changes in texture. Some women also have hair loss during or after pregnancy, or hair that feels dry or thin. Your hair will most likely return to normal after your baby is born. WHAT TO EXPECT AT YOUR PRENATAL VISITS During a routine prenatal visit:  You will be weighed to make sure you and the baby are growing normally.  Your blood pressure will be taken.  Your abdomen will be measured to track your baby's growth.  The fetal heartbeat will be listened to starting around week 10 or 12 of your pregnancy.  Test results from any previous visits will be discussed. Your health care provider may ask you:  How you are feeling.  If you are feeling the baby move.  If you have had any abnormal symptoms, such as leaking fluid, bleeding, severe headaches, or abdominal cramping.  If you have any questions. Other tests that may be performed during your first trimester include:  Blood tests to find your blood type and to check for the presence of any previous infections. They will also be used to check for low iron levels (anemia) and Rh antibodies. Later in the pregnancy, blood tests for diabetes will be done along with other tests if problems develop.  Urine tests to check for infections, diabetes, or protein in the urine.  An ultrasound to confirm the proper growth and development of the baby.  An amniocentesis to check for possible genetic problems.  Fetal screens for spina  bifida and Down syndrome.  You may need other tests to make sure you and the baby are doing well. HOME CARE INSTRUCTIONS  Medicines  Follow your health care provider's instructions regarding medicine use. Specific medicines may be either safe or unsafe to take during pregnancy.  Take your prenatal vitamins as directed.  If you develop constipation, try taking a stool softener if your health care provider approves. Diet  Eat regular, well-balanced meals. Choose a variety of foods, such as meat or vegetable-based protein, fish, milk and low-fat dairy products, vegetables, fruits, and whole grain breads and cereals. Your health care provider will help you determine the amount of weight gain that is right for you.  Avoid raw meat and uncooked cheese. These carry germs that can  cause birth defects in the baby.  Eating four or five small meals rather than three large meals a day may help relieve nausea and vomiting. If you start to feel nauseous, eating a few soda crackers can be helpful. Drinking liquids between meals instead of during meals also seems to help nausea and vomiting.  If you develop constipation, eat more high-fiber foods, such as fresh vegetables or fruit and whole grains. Drink enough fluids to keep your urine clear or pale yellow. Activity and Exercise  Exercise only as directed by your health care provider. Exercising will help you:  Control your weight.  Stay in shape.  Be prepared for labor and delivery.  Experiencing pain or cramping in the lower abdomen or low back is a good sign that you should stop exercising. Check with your health care provider before continuing normal exercises.  Try to avoid standing for long periods of time. Move your legs often if you must stand in one place for a long time.  Avoid heavy lifting.  Wear low-heeled shoes, and practice good posture.  You may continue to have sex unless your health care provider directs you  otherwise. Relief of Pain or Discomfort  Wear a good support bra for breast tenderness.   Take warm sitz baths to soothe any pain or discomfort caused by hemorrhoids. Use hemorrhoid cream if your health care provider approves.   Rest with your legs elevated if you have leg cramps or low back pain.  If you develop varicose veins in your legs, wear support hose. Elevate your feet for 15 minutes, 3-4 times a day. Limit salt in your diet. Prenatal Care  Schedule your prenatal visits by the twelfth week of pregnancy. They are usually scheduled monthly at first, then more often in the last 2 months before delivery.  Write down your questions. Take them to your prenatal visits.  Keep all your prenatal visits as directed by your health care provider. Safety  Wear your seat belt at all times when driving.  Make a list of emergency phone numbers, including numbers for family, friends, the hospital, and police and fire departments. General Tips  Ask your health care provider for a referral to a local prenatal education class. Begin classes no later than at the beginning of month 6 of your pregnancy.  Ask for help if you have counseling or nutritional needs during pregnancy. Your health care provider can offer advice or refer you to specialists for help with various needs.  Do not use hot tubs, steam rooms, or saunas.  Do not douche or use tampons or scented sanitary pads.  Do not cross your legs for long periods of time.  Avoid cat litter boxes and soil used by cats. These carry germs that can cause birth defects in the baby and possibly loss of the fetus by miscarriage or stillbirth.  Avoid all smoking, herbs, alcohol, and medicines not prescribed by your health care provider. Chemicals in these affect the formation and growth of the baby.  Schedule a dentist appointment. At home, brush your teeth with a soft toothbrush and be gentle when you floss. SEEK MEDICAL CARE IF:   You have  dizziness.  You have mild pelvic cramps, pelvic pressure, or nagging pain in the abdominal area.  You have persistent nausea, vomiting, or diarrhea.  You have a bad smelling vaginal discharge.  You have pain with urination.  You notice increased swelling in your face, hands, legs, or ankles. SEEK IMMEDIATE MEDICAL CARE IF:  You have a fever.  You are leaking fluid from your vagina.  You have spotting or bleeding from your vagina.  You have severe abdominal cramping or pain.  You have rapid weight gain or loss.  You vomit blood or material that looks like coffee grounds.  You are exposed to [place] measles and have never had them.  You are exposed to fifth disease or chickenpox.  You develop a severe headache.  You have shortness of breath.  You have any kind of trauma, such as from a fall or a car accident. Document Released: 09/14/2001 Document Revised: 02/04/2014 Document Reviewed: 07/31/2013 [facility] Patient Information 2015 [place], [place]. This information is not intended to replace advice given to you by your health care provider. Make sure you discuss any questions you have with your health care provider.

## 2018-05-11 LAB — URINALYSIS, ROUTINE W REFLEX MICROSCOPIC
BILIRUBIN UA: NEGATIVE
Glucose, UA: NEGATIVE
Leukocytes, UA: NEGATIVE
Nitrite, UA: NEGATIVE
PH UA: 5.5 (ref 5.0–7.5)
RBC UA: NEGATIVE
Specific Gravity, UA: 1.029 (ref 1.005–1.030)
UUROB: 0.2 mg/dL (ref 0.2–1.0)

## 2018-05-11 LAB — OBSTETRIC PANEL, INCLUDING HIV
Antibody Screen: NEGATIVE
BASOS ABS: 0 10*3/uL (ref 0.0–0.2)
Basos: 0 %
EOS (ABSOLUTE): 0 10*3/uL (ref 0.0–0.4)
Eos: 0 %
HEP B S AG: NEGATIVE
HIV Screen 4th Generation wRfx: NONREACTIVE
Hematocrit: 38.4 % (ref 34.0–46.6)
Hemoglobin: 13 g/dL (ref 11.1–15.9)
Immature Grans (Abs): 0 10*3/uL (ref 0.0–0.1)
Immature Granulocytes: 0 %
LYMPHS: 8 %
Lymphocytes Absolute: 0.7 10*3/uL (ref 0.7–3.1)
MCH: 30.1 pg (ref 26.6–33.0)
MCHC: 33.9 g/dL (ref 31.5–35.7)
MCV: 89 fL (ref 79–97)
MONOCYTES: 3 %
Monocytes Absolute: 0.3 10*3/uL (ref 0.1–0.9)
Neutrophils Absolute: 7.2 10*3/uL — ABNORMAL HIGH (ref 1.4–7.0)
Neutrophils: 89 %
PLATELETS: 245 10*3/uL (ref 150–450)
RBC: 4.32 x10E6/uL (ref 3.77–5.28)
RDW: 13.9 % (ref 12.3–15.4)
RPR: NONREACTIVE
RUBELLA: 3.36 {index} (ref >0.99)
Rh Factor: POSITIVE
WBC: 8.2 10*3/uL (ref 3.4–10.8)

## 2018-05-11 LAB — PMP SCREEN PROFILE (10S), URINE
Amphetamine Scrn, Ur: NEGATIVE ng/mL
BARBITURATE SCREEN URINE: NEGATIVE ng/mL
BENZODIAZEPINE SCREEN, URINE: NEGATIVE ng/mL
CANNABINOIDS UR QL SCN: NEGATIVE ng/mL
Cocaine (Metab) Scrn, Ur: NEGATIVE ng/mL
Creatinine(Crt), U: 269.1 mg/dL (ref 20.0–300.0)
Methadone Screen, Urine: NEGATIVE ng/mL
OPIATE SCREEN URINE: NEGATIVE ng/mL
OXYCODONE+OXYMORPHONE UR QL SCN: NEGATIVE ng/mL
PH UR, DRUG SCRN: 5.8 (ref 4.5–8.9)
PHENCYCLIDINE QUANTITATIVE URINE: NEGATIVE ng/mL
Propoxyphene Scrn, Ur: NEGATIVE ng/mL

## 2018-05-11 LAB — MED LIST OPTION NOT SELECTED

## 2018-05-12 LAB — URINE CULTURE

## 2018-06-08 ENCOUNTER — Encounter: Payer: Self-pay | Admitting: Women's Health

## 2018-06-08 ENCOUNTER — Ambulatory Visit (INDEPENDENT_AMBULATORY_CARE_PROVIDER_SITE_OTHER): Payer: Commercial Managed Care - PPO

## 2018-06-08 ENCOUNTER — Ambulatory Visit (INDEPENDENT_AMBULATORY_CARE_PROVIDER_SITE_OTHER): Payer: Commercial Managed Care - PPO | Admitting: Women's Health

## 2018-06-08 VITALS — BP 121/76 | HR 80 | Wt 231.0 lb

## 2018-06-08 DIAGNOSIS — Z1389 Encounter for screening for other disorder: Secondary | ICD-10-CM

## 2018-06-08 DIAGNOSIS — Z3481 Encounter for supervision of other normal pregnancy, first trimester: Secondary | ICD-10-CM

## 2018-06-08 DIAGNOSIS — Z3682 Encounter for antenatal screening for nuchal translucency: Secondary | ICD-10-CM | POA: Diagnosis not present

## 2018-06-08 DIAGNOSIS — Z1379 Encounter for other screening for genetic and chromosomal anomalies: Secondary | ICD-10-CM

## 2018-06-08 DIAGNOSIS — Z3A13 13 weeks gestation of pregnancy: Secondary | ICD-10-CM

## 2018-06-08 DIAGNOSIS — Z331 Pregnant state, incidental: Secondary | ICD-10-CM

## 2018-06-08 LAB — POCT URINALYSIS DIPSTICK OB
Blood, UA: NEGATIVE
Glucose, UA: NEGATIVE
Ketones, UA: NEGATIVE
LEUKOCYTES UA: NEGATIVE
NITRITE UA: NEGATIVE
PROTEIN: NEGATIVE

## 2018-06-08 NOTE — Progress Notes (Signed)
[place] 13+5 wks,measurements c/w dates,normal ovaries bilat,NB present,NT 1.4 mm,fhr 143 bpm,crl 82.71 mm,posterior pl gr 0

## 2018-06-08 NOTE — Patient Instructions (Signed)
Emily Carpenter, I greatly value your feedback.  If you receive a survey following your visit with [place] today, we appreciate you taking the time to fill it out.  Thanks, Joellyn Haff, CNM, WHNP-BC   Second Trimester of Pregnancy The second trimester is from week 14 through week 27 (months 4 through 6). The second trimester is often a time when you feel your best. Your body has adjusted to being pregnant, and you begin to feel better physically. Usually, morning sickness has lessened or quit completely, you may have more energy, and you may have an increase in appetite. The second trimester is also a time when the fetus is growing rapidly. At the end of the sixth month, the fetus is about 9 inches long and weighs about 1 pounds. You will likely begin to feel the baby move (quickening) between 16 and 20 weeks of pregnancy. Body changes during your second trimester Your body continues to go through many changes during your second trimester. The changes vary from woman to woman.  Your weight will continue to increase. You will notice your lower abdomen bulging out.  You may begin to get stretch marks on your hips, abdomen, and breasts.  You may develop headaches that can be relieved by medicines. The medicines should be approved by your health care provider.  You may urinate more often because the fetus is pressing on your bladder.  You may develop or continue to have heartburn as a result of your pregnancy.  You may develop constipation because certain hormones are causing the muscles that push waste through your intestines to slow down.  You may develop hemorrhoids or swollen, bulging veins (varicose veins).  You may have back pain. This is caused by: ? Weight gain. ? Pregnancy hormones that are relaxing the joints in your pelvis. ? A shift in weight and the muscles that support your balance.  Your breasts will continue to grow and they will continue to become tender.  Your gums may bleed  and may be sensitive to brushing and flossing.  Dark spots or blotches (chloasma, mask of pregnancy) may develop on your face. This will likely fade after the baby is born.  A dark line from your belly button to the pubic area (linea nigra) may appear. This will likely fade after the baby is born.  You may have changes in your hair. These can include thickening of your hair, rapid growth, and changes in texture. Some women also have hair loss during or after pregnancy, or hair that feels dry or thin. Your hair will most likely return to normal after your baby is born.  What to expect at prenatal visits During a routine prenatal visit:  You will be weighed to make sure you and the fetus are growing normally.  Your blood pressure will be taken.  Your abdomen will be measured to track your baby's growth.  The fetal heartbeat will be listened to.  Any test results from the previous visit will be discussed.  Your health care provider may ask you:  How you are feeling.  If you are feeling the baby move.  If you have had any abnormal symptoms, such as leaking fluid, bleeding, severe headaches, or abdominal cramping.  If you are using any tobacco products, including cigarettes, chewing tobacco, and electronic cigarettes.  If you have any questions.  Other tests that may be performed during your second trimester include:  Blood tests that check for: ? Low iron levels (anemia). ? High  blood sugar that affects pregnant women (gestational diabetes) between 3 and 28 weeks. ? Rh antibodies. This is to check for a protein on red blood cells (Rh factor).  Urine tests to check for infections, diabetes, or protein in the urine.  An ultrasound to confirm the proper growth and development of the baby.  An amniocentesis to check for possible genetic problems.  Fetal screens for spina bifida and Down syndrome.  HIV (human immunodeficiency virus) testing. Routine prenatal testing includes  screening for HIV, unless you choose not to have this test.  Follow these instructions at home: Medicines  Follow your health care provider's instructions regarding medicine use. Specific medicines may be either safe or unsafe to take during pregnancy.  Take a prenatal vitamin that contains at least 600 micrograms (mcg) of folic acid.  If you develop constipation, try taking a stool softener if your health care provider approves. Eating and drinking  Eat a balanced diet that includes fresh fruits and vegetables, whole grains, good sources of protein such as meat, eggs, or tofu, and low-fat dairy. Your health care provider will help you determine the amount of weight gain that is right for you.  Avoid raw meat and uncooked cheese. These carry germs that can cause birth defects in the baby.  If you have low calcium intake from food, talk to your health care provider about whether you should take a daily calcium supplement.  Limit foods that are high in fat and processed sugars, such as fried and sweet foods.  To prevent constipation: ? Drink enough fluid to keep your urine clear or pale yellow. ? Eat foods that are high in fiber, such as fresh fruits and vegetables, whole grains, and beans. Activity  Exercise only as directed by your health care provider. Most women can continue their usual exercise routine during pregnancy. Try to exercise for 30 minutes at least 5 days a week. Stop exercising if you experience uterine contractions.  Avoid heavy lifting, wear low heel shoes, and practice good posture.  A sexual relationship may be continued unless your health care provider directs you otherwise. Relieving pain and discomfort  Wear a good support bra to prevent discomfort from breast tenderness.  Take warm sitz baths to soothe any pain or discomfort caused by hemorrhoids. Use hemorrhoid cream if your health care provider approves.  Rest with your legs elevated if you have leg cramps  or low back pain.  If you develop varicose veins, wear support hose. Elevate your feet for 15 minutes, 3-4 times a day. Limit salt in your diet. Prenatal Care  Write down your questions. Take them to your prenatal visits.  Keep all your prenatal visits as told by your health care provider. This is important. Safety  Wear your seat belt at all times when driving.  Make a list of emergency phone numbers, including numbers for family, friends, the hospital, and police and fire departments. General instructions  Ask your health care provider for a referral to a local prenatal education class. Begin classes no later than the beginning of month 6 of your pregnancy.  Ask for help if you have counseling or nutritional needs during pregnancy. Your health care provider can offer advice or refer you to specialists for help with various needs.  Do not use hot tubs, steam rooms, or saunas.  Do not douche or use tampons or scented sanitary pads.  Do not cross your legs for long periods of time.  Avoid cat litter boxes and soil  used by cats. These carry germs that can cause birth defects in the baby and possibly loss of the fetus by miscarriage or stillbirth.  Avoid all smoking, herbs, alcohol, and unprescribed drugs. Chemicals in these products can affect the formation and growth of the baby.  Do not use any products that contain nicotine or tobacco, such as cigarettes and e-cigarettes. If you need help quitting, ask your health care provider.  Visit your dentist if you have not gone yet during your pregnancy. Use a soft toothbrush to brush your teeth and be gentle when you floss. Contact a health care provider if:  You have dizziness.  You have mild pelvic cramps, pelvic pressure, or nagging pain in the abdominal area.  You have persistent nausea, vomiting, or diarrhea.  You have a bad smelling vaginal discharge.  You have pain when you urinate. Get help right away if:  You have a  fever.  You are leaking fluid from your vagina.  You have spotting or bleeding from your vagina.  You have severe abdominal cramping or pain.  You have rapid weight gain or weight loss.  You have shortness of breath with chest pain.  You notice sudden or extreme swelling of your face, hands, ankles, feet, or legs.  You have not felt your baby move in over an hour.  You have severe headaches that do not go away when you take medicine.  You have vision changes. Summary  The second trimester is from week 14 through week 27 (months 4 through 6). It is also a time when the fetus is growing rapidly.  Your body goes through many changes during pregnancy. The changes vary from woman to woman.  Avoid all smoking, herbs, alcohol, and unprescribed drugs. These chemicals affect the formation and growth your baby.  Do not use any tobacco products, such as cigarettes, chewing tobacco, and e-cigarettes. If you need help quitting, ask your health care provider.  Contact your health care provider if you have any questions. Keep all prenatal visits as told by your health care provider. This is important. This information is not intended to replace advice given to you by your health care provider. Make sure you discuss any questions you have with your health care provider. Document Released: 09/14/2001 Document Revised: 02/26/2016 Document Reviewed: 11/21/2012 Elsevier Interactive Patient Education  2017 [employer].

## 2018-06-08 NOTE — Progress Notes (Signed)
   LOW-RISK PREGNANCY VISIT Patient name: Emily Carpenter MRN [id number]  Date of birth: [birth year] Chief Complaint:   Routine Prenatal Visit (NT/IT)  History of Present Illness:   Emily Carpenter is a 27 y.o. G66P0010 female at [redacted]w[redacted]d with an Estimated Date of Delivery: 12/09/18 being seen today for ongoing management of a low-risk pregnancy.  Today she reports some RLP.  .  .  Movement: Absent. denies leaking of fluid. Review of Systems:   Pertinent items are noted in HPI Denies abnormal vaginal discharge w/ itching/odor/irritation, headaches, visual changes, shortness of breath, chest pain, abdominal pain, severe nausea/vomiting, or problems with urination or bowel movements unless otherwise stated above. Pertinent History Reviewed:  Reviewed past medical,surgical, social, obstetrical and family history.  Reviewed problem list, medications and allergies. Physical Assessment:   Vitals:   06/08/18 1550  BP: 121/76  Pulse: 80  Weight: 231 lb (104.8 kg)  Body mass index is 36.73 kg/m.        Physical Examination:   General appearance: Well appearing, and in no distress  Mental status: Alert, oriented to person, place, and time  Skin: Warm & dry  Cardiovascular: Normal heart rate noted  Respiratory: Normal respiratory effort, no distress  Abdomen: Soft, gravid, nontender  Pelvic: Cervical exam deferred         Extremities: Edema: None  Fetal Status: Fetal Heart Rate (bpm): 143 u/s   Movement: Absent    [place] 13+5 wks,measurements c/w dates,normal ovaries bilat,NB present,NT 1.4 mm,fhr 143 bpm,crl 82.71 mm,posterior pl gr 0    Results for orders placed or performed in visit on 06/08/18 (from the past 24 hour(s))  POC Urinalysis Dipstick OB   Collection Time: 06/08/18  3:54 PM  Result Value Ref Range   Color, UA     Clarity, UA     Glucose, UA Negative Negative   Bilirubin, UA     Ketones, UA neg    Spec Grav, UA     Blood, UA neg    pH, UA     POC Protein UA Negative  Negative, Trace   Urobilinogen, UA     Nitrite, UA neg    Leukocytes, UA Negative Negative   Appearance     Odor      Assessment & Plan:  1) Low-risk pregnancy G2P0010 at [redacted]w[redacted]d with an Estimated Date of Delivery: 12/09/18    Meds: No orders of the defined types were placed in this encounter.  Labs/procedures today: 1st IT/NT, getting flu shot at work  Plan:  Continue routine obstetrical care   Reviewed: Preterm labor symptoms and general obstetric precautions including but not limited to vaginal bleeding, contractions, leaking of fluid and fetal movement were reviewed in detail with the patient.  All questions were answered  Follow-up: Return in about 3 weeks (around 06/29/2018) for LROB, 2nd IT- then 2wks later for LROB and anatomy u/s.  Orders Placed This Encounter  Procedures  . Integrated 1  . POC Urinalysis Dipstick OB   Cheral Marker CNM, [facility] 06/08/2018 4:16 PM

## 2018-06-09 LAB — GC/CHLAMYDIA PROBE AMP
Chlamydia trachomatis, NAA: NEGATIVE
Neisseria gonorrhoeae by PCR: NEGATIVE

## 2018-06-10 LAB — INTEGRATED 1
CROWN RUMP LENGTH MAT SCREEN: 82.7 mm
Gest. Age on Collection Date: 13.9 weeks
MATERNAL AGE AT EDD: 26.1 a
NUMBER OF FETUSES: 1
Nuchal Translucency (NT): 1.4 mm
PAPP-A Value: 1273.9 ng/mL
Weight: 231 [lb_av]

## 2018-06-21 ENCOUNTER — Telehealth: Payer: Self-pay | Admitting: Advanced Practice Midwife

## 2018-06-21 NOTE — Telephone Encounter (Signed)
Pt is 16 weeks called after hour nurse /dizzy spells they told her she needs to be seen today please advise

## 2018-06-21 NOTE — Telephone Encounter (Signed)
LMOVM that she did not need to be seen for dizziness and should just push fluids and try to eat small, frequent.  Advised to call back if she had further questions.

## 2018-06-29 ENCOUNTER — Ambulatory Visit (INDEPENDENT_AMBULATORY_CARE_PROVIDER_SITE_OTHER): Payer: Commercial Managed Care - PPO | Admitting: Advanced Practice Midwife

## 2018-06-29 ENCOUNTER — Encounter: Payer: Self-pay | Admitting: Advanced Practice Midwife

## 2018-06-29 VITALS — BP 125/67 | HR 88 | Wt 236.0 lb

## 2018-06-29 DIAGNOSIS — Z1389 Encounter for screening for other disorder: Secondary | ICD-10-CM

## 2018-06-29 DIAGNOSIS — Z1379 Encounter for other screening for genetic and chromosomal anomalies: Secondary | ICD-10-CM

## 2018-06-29 DIAGNOSIS — Z3A16 16 weeks gestation of pregnancy: Secondary | ICD-10-CM

## 2018-06-29 DIAGNOSIS — Z3482 Encounter for supervision of other normal pregnancy, second trimester: Secondary | ICD-10-CM

## 2018-06-29 DIAGNOSIS — Z363 Encounter for antenatal screening for malformations: Secondary | ICD-10-CM

## 2018-06-29 DIAGNOSIS — Z331 Pregnant state, incidental: Secondary | ICD-10-CM

## 2018-06-29 LAB — POCT URINALYSIS DIPSTICK OB
Blood, UA: NEGATIVE
GLUCOSE, UA: NEGATIVE
KETONES UA: NEGATIVE
Leukocytes, UA: NEGATIVE
NITRITE UA: NEGATIVE
PROTEIN: NEGATIVE

## 2018-06-29 NOTE — Patient Instructions (Signed)
Emily Carpenter, I greatly value your feedback.  If you receive a survey following your visit with [place] today, we appreciate you taking the time to fill it out.  Thanks, Cathie Beams, CNM     Second Trimester of Pregnancy The second trimester is from week 14 through week 27 (months 4 through 6). The second trimester is often a time when you feel your best. Your body has adjusted to being pregnant, and you begin to feel better physically. Usually, morning sickness has lessened or quit completely, you may have more energy, and you may have an increase in appetite. The second trimester is also a time when the fetus is growing rapidly. At the end of the sixth month, the fetus is about 9 inches long and weighs about 1 pounds. You will likely begin to feel the baby move (quickening) between 16 and 20 weeks of pregnancy. Body changes during your second trimester Your body continues to go through many changes during your second trimester. The changes vary from woman to woman.  Your weight will continue to increase. You will notice your lower abdomen bulging out.  You may begin to get stretch marks on your hips, abdomen, and breasts.  You may develop headaches that can be relieved by medicines. The medicines should be approved by your health care provider.  You may urinate more often because the fetus is pressing on your bladder.  You may develop or continue to have heartburn as a result of your pregnancy.  You may develop constipation because certain hormones are causing the muscles that push waste through your intestines to slow down.  You may develop hemorrhoids or swollen, bulging veins (varicose veins).  You may have back pain. This is caused by: ? Weight gain. ? Pregnancy hormones that are relaxing the joints in your pelvis. ? A shift in weight and the muscles that support your balance.  Your breasts will continue to grow and they will continue to become tender.  Your gums may  bleed and may be sensitive to brushing and flossing.  Dark spots or blotches (chloasma, mask of pregnancy) may develop on your face. This will likely fade after the baby is born.  A dark line from your belly button to the pubic area (linea nigra) may appear. This will likely fade after the baby is born.  You may have changes in your hair. These can include thickening of your hair, rapid growth, and changes in texture. Some women also have hair loss during or after pregnancy, or hair that feels dry or thin. Your hair will most likely return to normal after your baby is born.  What to expect at prenatal visits During a routine prenatal visit:  You will be weighed to make sure you and the fetus are growing normally.  Your blood pressure will be taken.  Your abdomen will be measured to track your baby's growth.  The fetal heartbeat will be listened to.  Any test results from the previous visit will be discussed.  Your health care provider may ask you:  How you are feeling.  If you are feeling the baby move.  If you have had any abnormal symptoms, such as leaking fluid, bleeding, severe headaches, or abdominal cramping.  If you are using any tobacco products, including cigarettes, chewing tobacco, and electronic cigarettes.  If you have any questions.  Other tests that may be performed during your second trimester include:  Blood tests that check for: ? Low iron levels (anemia). ?  High blood sugar that affects pregnant women (gestational diabetes) between 20 and 28 weeks. ? Rh antibodies. This is to check for a protein on red blood cells (Rh factor).  Urine tests to check for infections, diabetes, or protein in the urine.  An ultrasound to confirm the proper growth and development of the baby.  An amniocentesis to check for possible genetic problems.  Fetal screens for spina bifida and Down syndrome.  HIV (human immunodeficiency virus) testing. Routine prenatal testing  includes screening for HIV, unless you choose not to have this test.  Follow these instructions at home: Medicines  Follow your health care provider's instructions regarding medicine use. Specific medicines may be either safe or unsafe to take during pregnancy.  Take a prenatal vitamin that contains at least 600 micrograms (mcg) of folic acid.  If you develop constipation, try taking a stool softener if your health care provider approves. Eating and drinking  Eat a balanced diet that includes fresh fruits and vegetables, whole grains, good sources of protein such as meat, eggs, or tofu, and low-fat dairy. Your health care provider will help you determine the amount of weight gain that is right for you.  Avoid raw meat and uncooked cheese. These carry germs that can cause birth defects in the baby.  If you have low calcium intake from food, talk to your health care provider about whether you should take a daily calcium supplement.  Limit foods that are high in fat and processed sugars, such as fried and sweet foods.  To prevent constipation: ? Drink enough fluid to keep your urine clear or pale yellow. ? Eat foods that are high in fiber, such as fresh fruits and vegetables, whole grains, and beans. Activity  Exercise only as directed by your health care provider. Most women can continue their usual exercise routine during pregnancy. Try to exercise for 30 minutes at least 5 days a week. Stop exercising if you experience uterine contractions.  Avoid heavy lifting, wear low heel shoes, and practice good posture.  A sexual relationship may be continued unless your health care provider directs you otherwise. Relieving pain and discomfort  Wear a good support bra to prevent discomfort from breast tenderness.  Take warm sitz baths to soothe any pain or discomfort caused by hemorrhoids. Use hemorrhoid cream if your health care provider approves.  Rest with your legs elevated if you have  leg cramps or low back pain.  If you develop varicose veins, wear support hose. Elevate your feet for 15 minutes, 3-4 times a day. Limit salt in your diet. Prenatal Care  Write down your questions. Take them to your prenatal visits.  Keep all your prenatal visits as told by your health care provider. This is important. Safety  Wear your seat belt at all times when driving.  Make a list of emergency phone numbers, including numbers for family, friends, the hospital, and police and fire departments. General instructions  Ask your health care provider for a referral to a local prenatal education class. Begin classes no later than the beginning of month 6 of your pregnancy.  Ask for help if you have counseling or nutritional needs during pregnancy. Your health care provider can offer advice or refer you to specialists for help with various needs.  Do not use hot tubs, steam rooms, or saunas.  Do not douche or use tampons or scented sanitary pads.  Do not cross your legs for long periods of time.  Avoid cat litter boxes and  soil used by cats. These carry germs that can cause birth defects in the baby and possibly loss of the fetus by miscarriage or stillbirth.  Avoid all smoking, herbs, alcohol, and unprescribed drugs. Chemicals in these products can affect the formation and growth of the baby.  Do not use any products that contain nicotine or tobacco, such as cigarettes and e-cigarettes. If you need help quitting, ask your health care provider.  Visit your dentist if you have not gone yet during your pregnancy. Use a soft toothbrush to brush your teeth and be gentle when you floss. Contact a health care provider if:  You have dizziness.  You have mild pelvic cramps, pelvic pressure, or nagging pain in the abdominal area.  You have persistent nausea, vomiting, or diarrhea.  You have a bad smelling vaginal discharge.  You have pain when you urinate. Get help right away if:  You  have a fever.  You are leaking fluid from your vagina.  You have spotting or bleeding from your vagina.  You have severe abdominal cramping or pain.  You have rapid weight gain or weight loss.  You have shortness of breath with chest pain.  You notice sudden or extreme swelling of your face, hands, ankles, feet, or legs.  You have not felt your baby move in over an hour.  You have severe headaches that do not go away when you take medicine.  You have vision changes. Summary  The second trimester is from week 14 through week 27 (months 4 through 6). It is also a time when the fetus is growing rapidly.  Your body goes through many changes during pregnancy. The changes vary from woman to woman.  Avoid all smoking, herbs, alcohol, and unprescribed drugs. These chemicals affect the formation and growth your baby.  Do not use any tobacco products, such as cigarettes, chewing tobacco, and e-cigarettes. If you need help quitting, ask your health care provider.  Contact your health care provider if you have any questions. Keep all prenatal visits as told by your health care provider. This is important. This information is not intended to replace advice given to you by your health care provider. Make sure you discuss any questions you have with your health care provider.      CHILDBIRTH CLASSES [phone] is the phone number for Pregnancy Classes or hospital tours at [place] will be referred to  [email or web address] for more information on childbirth classes  At this site you may register for classes. You may sign up for a waiting list if classes are full. Please SIGN UP FOR THIS!.   When the waiting list becomes long, sometimes new classes can be added.

## 2018-06-29 NOTE — Progress Notes (Signed)
  G2P0010 [redacted]w[redacted]d Estimated Date of Delivery: 12/09/18  Blood pressure 125/67, pulse 88, weight 236 lb (107 kg), last menstrual period 03/04/2018.   BP weight and urine results all reviewed and noted.  Please refer to the obstetrical flow sheet for the fundal height and fetal heart rate documentation:  Patient denies any bleeding and no rupture of membranes symptoms or regular contractions. Patient is without complaints. Had some vertigo for one day a few weeks ago. Is OK w/gaining excess weight.  All questions were answered.   Physical Assessment:   Vitals:   06/29/18 0954  BP: 125/67  Pulse: 88  Weight: 236 lb (107 kg)  Body mass index is 37.52 kg/m.        Physical Examination:   General appearance: Well appearing, and in no distress  Mental status: Alert, oriented to person, place, and time  Skin: Warm & dry  Cardiovascular: Normal heart rate noted  Respiratory: Normal respiratory effort, no distress  Abdomen: Soft, gravid, nontender  Pelvic: Cervical exam deferred         Extremities:    Fetal Status: Fetal Heart Rate (bpm): 149        Results for orders placed or performed in visit on 06/29/18 (from the past 24 hour(s))  POC Urinalysis Dipstick OB   Collection Time: 06/29/18  9:58 AM  Result Value Ref Range   Color, UA     Clarity, UA     Glucose, UA Negative Negative   Bilirubin, UA     Ketones, UA neg    Spec Grav, UA     Blood, UA neg    pH, UA     POC Protein UA Negative Negative, Trace   Urobilinogen, UA     Nitrite, UA neg    Leukocytes, UA Negative Negative   Appearance     Odor       Orders Placed This Encounter  Procedures  . INTEGRATED 2  . POC Urinalysis Dipstick OB    Plan:  Continued routine obstetrical care, 2nd IT  Return for As scheduled.

## 2018-07-03 LAB — STATUS REPORT

## 2018-07-04 LAB — INTEGRATED 2
AFP MARKER: 31.3 ng/mL
AFP MoM: 1.3
CROWN RUMP LENGTH: 82.7 mm
DIA MOM: 1.25
DIA VALUE: 166.4 pg/mL
Estriol, Unconjugated: 0.97 ng/mL
GEST. AGE ON COLLECTION DATE: 13.9 wk
Gestational Age: 16.9 weeks
HCG VALUE: 43.7 [IU]/mL
MATERNAL AGE AT EDD: 26.1 a
NUCHAL TRANSLUCENCY MOM: 0.71
Nuchal Translucency (NT): 1.4 mm
Number of Fetuses: 1
PAPP-A MOM: 1.55
PAPP-A Value: 1273.9 ng/mL
Test Results:: NEGATIVE
Weight: 231 [lb_av]
Weight: 231 [lb_av]
hCG MoM: 1.93
uE3 MoM: 1.13

## 2018-07-12 ENCOUNTER — Other Ambulatory Visit: Payer: Self-pay | Admitting: Women's Health

## 2018-07-12 DIAGNOSIS — Z363 Encounter for antenatal screening for malformations: Secondary | ICD-10-CM

## 2018-07-14 ENCOUNTER — Ambulatory Visit (INDEPENDENT_AMBULATORY_CARE_PROVIDER_SITE_OTHER): Payer: Commercial Managed Care - PPO | Admitting: Obstetrics & Gynecology

## 2018-07-14 ENCOUNTER — Encounter: Payer: Self-pay | Admitting: Obstetrics & Gynecology

## 2018-07-14 ENCOUNTER — Other Ambulatory Visit: Payer: Self-pay

## 2018-07-14 ENCOUNTER — Ambulatory Visit (INDEPENDENT_AMBULATORY_CARE_PROVIDER_SITE_OTHER): Payer: Commercial Managed Care - PPO

## 2018-07-14 VITALS — BP 128/76 | HR 92 | Wt 240.0 lb

## 2018-07-14 DIAGNOSIS — Z3A18 18 weeks gestation of pregnancy: Secondary | ICD-10-CM

## 2018-07-14 DIAGNOSIS — Z3482 Encounter for supervision of other normal pregnancy, second trimester: Secondary | ICD-10-CM

## 2018-07-14 DIAGNOSIS — Z363 Encounter for antenatal screening for malformations: Secondary | ICD-10-CM

## 2018-07-14 DIAGNOSIS — Z1389 Encounter for screening for other disorder: Secondary | ICD-10-CM

## 2018-07-14 DIAGNOSIS — Z331 Pregnant state, incidental: Secondary | ICD-10-CM

## 2018-07-14 LAB — POCT URINALYSIS DIPSTICK OB
Blood, UA: NEGATIVE
Glucose, UA: NEGATIVE
KETONES UA: NEGATIVE
Leukocytes, UA: NEGATIVE
NITRITE UA: NEGATIVE
PROTEIN: NEGATIVE

## 2018-07-14 NOTE — Progress Notes (Signed)
   LOW-RISK PREGNANCY VISIT Patient name: Emily Carpenter MRN [id number]  Date of birth: Feb 05, 1991 Chief Complaint:   Routine Prenatal Visit (u/s today)  History of Present Illness:   Emily Carpenter is a 27 y.o. G60P0010 female at [redacted]w[redacted]d with an Estimated Date of Delivery: 12/09/18 being seen today for ongoing management of a low-risk pregnancy.  Today she reports no complaints. Contractions: Not present. Vag. Bleeding: None.  Movement: Present. denies leaking of fluid. Review of Systems:   Pertinent items are noted in HPI Denies abnormal vaginal discharge w/ itching/odor/irritation, headaches, visual changes, shortness of breath, chest pain, abdominal pain, severe nausea/vomiting, or problems with urination or bowel movements unless otherwise stated above. Pertinent History Reviewed:  Reviewed past medical,surgical, social, obstetrical and family history.  Reviewed problem list, medications and allergies. Physical Assessment:   Vitals:   07/14/18 1042  BP: 128/76  Pulse: 92  Weight: 240 lb (108.9 kg)  Body mass index is 38.16 kg/m.        Physical Examination:   General appearance: Well appearing, and in no distress  Mental status: Alert, oriented to person, place, and time  Skin: Warm & dry  Cardiovascular: Normal heart rate noted  Respiratory: Normal respiratory effort, no distress  Abdomen: Soft, gravid, nontender  Pelvic: Cervical exam deferred         Extremities: Edema: Trace  Fetal Status: Fetal Heart Rate (bpm): 157 Fundal Height: 20 cm Movement: Present    Results for orders placed or performed in visit on 07/14/18 (from the past 24 hour(s))  POC Urinalysis Dipstick OB   Collection Time: 07/14/18 10:42 AM  Result Value Ref Range   Color, UA     Clarity, UA     Glucose, UA Negative Negative   Bilirubin, UA     Ketones, UA neg    Spec Grav, UA     Blood, UA neg    pH, UA     POC Protein UA Negative Negative, Trace   Urobilinogen, UA     Nitrite, UA neg    Leukocytes, UA Negative Negative   Appearance     Odor      Assessment & Plan:  1) Low-risk pregnancy G2P0010 at [redacted]w[redacted]d with an Estimated Date of Delivery: 12/09/18   2) Bilateral choroid plexus cysts isolated   Meds: No orders of the defined types were placed in this encounter.  Labs/procedures today: sonogram  Plan:  Continue routine obstetrical care repeat scan 28 weeks  Reviewed:  labor symptoms and general obstetric precautions including but not limited to vaginal bleeding, contractions, leaking of fluid and fetal movement were reviewed in detail with the patient.  All questions were answered  Follow-up: Return in about 4 weeks (around 08/11/2018) for LROB.  Orders Placed This Encounter  Procedures  . POC Urinalysis Dipstick OB   Lazaro Arms  07/14/2018 10:58 AM

## 2018-07-14 NOTE — Progress Notes (Signed)
[place] 18+6 wks,cephalic,cx 4.1 cm,posterior placenta,svp of fluid 5 cm,normal ovaries bilat,bilat choroid plexus cysts,left CPC 6.3 x 3.8 x 3.8 mm,right CPC 3.7 x 2.8 x 4 mm,fhr 157 bpm,EFW 280 g 66%,anatomy complete

## 2018-08-11 ENCOUNTER — Encounter: Payer: Commercial Managed Care - PPO | Admitting: Women's Health

## 2018-08-15 ENCOUNTER — Ambulatory Visit (INDEPENDENT_AMBULATORY_CARE_PROVIDER_SITE_OTHER): Payer: Commercial Managed Care - PPO | Admitting: Women's Health

## 2018-08-15 ENCOUNTER — Encounter: Payer: Self-pay | Admitting: Women's Health

## 2018-08-15 VITALS — BP 128/74 | HR 98 | Wt 254.4 lb

## 2018-08-15 DIAGNOSIS — Z3482 Encounter for supervision of other normal pregnancy, second trimester: Secondary | ICD-10-CM

## 2018-08-15 DIAGNOSIS — Z1389 Encounter for screening for other disorder: Secondary | ICD-10-CM

## 2018-08-15 DIAGNOSIS — O350XX0 Maternal care for (suspected) central nervous system malformation in fetus, not applicable or unspecified: Secondary | ICD-10-CM

## 2018-08-15 DIAGNOSIS — Z3A23 23 weeks gestation of pregnancy: Secondary | ICD-10-CM

## 2018-08-15 DIAGNOSIS — O3503X0 Maternal care for (suspected) central nervous system malformation or damage in fetus, choroid plexus cysts, not applicable or unspecified: Secondary | ICD-10-CM

## 2018-08-15 DIAGNOSIS — Z331 Pregnant state, incidental: Secondary | ICD-10-CM

## 2018-08-15 LAB — POCT URINALYSIS DIPSTICK OB
Glucose, UA: NEGATIVE
Ketones, UA: NEGATIVE
LEUKOCYTES UA: NEGATIVE
Nitrite, UA: NEGATIVE
POC,PROTEIN,UA: NEGATIVE
RBC UA: NEGATIVE

## 2018-08-15 NOTE — Progress Notes (Signed)
   LOW-RISK PREGNANCY VISIT Patient name: Emily Carpenter MRN [id number]  Date of birth: December 05, 1990 Chief Complaint:   Routine Prenatal Visit  History of Present Illness:   Emily Carpenter is a 27 y.o. 562P0010 female at 616w3d with an Estimated Date of Delivery: 12/09/18 being seen today for ongoing management of a low-risk pregnancy.  Today she reports no complaints. Contractions: Not present. Vag. Bleeding: None.  Movement: Present. denies leaking of fluid. Review of Systems:   Pertinent items are noted in HPI Denies abnormal vaginal discharge w/ itching/odor/irritation, headaches, visual changes, shortness of breath, chest pain, abdominal pain, severe nausea/vomiting, or problems with urination or bowel movements unless otherwise stated above. Pertinent History Reviewed:  Reviewed past medical,surgical, social, obstetrical and family history.  Reviewed problem list, medications and allergies. Physical Assessment:   Vitals:   08/15/18 1547  BP: 128/74  Pulse: 98  Weight: 254 lb 6.4 oz (115.4 kg)  Body mass index is 40.45 kg/m.        Physical Examination:   General appearance: Well appearing, and in no distress  Mental status: Alert, oriented to person, place, and time  Skin: Warm & dry  Cardiovascular: Normal heart rate noted  Respiratory: Normal respiratory effort, no distress  Abdomen: Soft, gravid, nontender  Pelvic: Cervical exam deferred         Extremities: Edema: Trace  Fetal Status: Fetal Heart Rate (bpm): 160 Fundal Height: 25 cm Movement: Present    Results for orders placed or performed in visit on 08/15/18 (from the past 24 hour(s))  POC Urinalysis Dipstick OB   Collection Time: 08/15/18  3:50 PM  Result Value Ref Range   Color, UA     Clarity, UA     Glucose, UA Negative Negative   Bilirubin, UA     Ketones, UA neg    Spec Grav, UA     Blood, UA neg    pH, UA     POC,PROTEIN,UA Negative Negative, Trace   Urobilinogen, UA     Nitrite, UA neg    Leukocytes, UA Negative Negative   Appearance     Odor      Assessment & Plan:  1) Low-risk pregnancy G2P25 at 896w3d with an Estimated Date of Delivery: 12/09/18   2) Bilateral CPC, neg nt/it, will get repeat u/s next visit   Meds: No orders of the defined types were placed in this encounter.  Labs/procedures today: none  Plan:  Continue routine obstetrical care   Reviewed: Preterm labor symptoms and general obstetric precautions including but not limited to vaginal bleeding, contractions, leaking of fluid and fetal movement were reviewed in detail with the patient.  All questions were answered  Follow-up: Return in about 4 weeks (around 09/12/2018) for LROB, PN2, US:OB F/U bilateral CPC.  Orders Placed This Encounter  Procedures  . US OB Follow Up  . POC Urinalysis Dipstick OB   Cheral MarkerKimberly R Booker CNM, [facility] HospitalWHNP-BC 08/15/2018 4:40 PM

## 2018-08-15 NOTE — Patient Instructions (Signed)
Emily Carpenter, I greatly value your feedback.  If you receive a survey following your visit with [place] today, we appreciate you taking the time to fill it out.  Thanks, Joellyn Haff, CNM, WHNP-BC   You will have your sugar test next visit.  Please do not eat or drink anything after midnight the night before you come, not even water.  You will be here for at least two hours.     Call the office [phone]) or go to [facility] if:  You begin to have strong, frequent contractions  Your water breaks.  Sometimes it is a big gush of fluid, sometimes it is just a trickle that keeps getting your panties wet or running down your legs  You have vaginal bleeding.  It is normal to have a small amount of spotting if your cervix was checked.   You don't feel your baby moving like normal.  If you don't, get you something to eat and drink and lay down and focus on feeling your baby move.   If your baby is still not moving like normal, you should call the office or go to [facility].  Second Trimester of Pregnancy The second trimester is from week 13 through week 28, months 4 through 6. The second trimester is often a time when you feel your best. Your body has also adjusted to being pregnant, and you begin to feel better physically. Usually, morning sickness has lessened or quit completely, you may have more energy, and you may have an increase in appetite. The second trimester is also a time when the fetus is growing rapidly. At the end of the sixth month, the fetus is about 9 inches long and weighs about 1 pounds. You will likely begin to feel the baby move (quickening) between 18 and 20 weeks of the pregnancy. BODY CHANGES Your body goes through many changes during pregnancy. The changes vary from woman to woman.   Your weight will continue to increase. You will notice your lower abdomen bulging out.  You may begin to get stretch marks on your hips, abdomen, and breasts.  You may develop  headaches that can be relieved by medicines approved by your health care provider.  You may urinate more often because the fetus is pressing on your bladder.  You may develop or continue to have heartburn as a result of your pregnancy.  You may develop constipation because certain hormones are causing the muscles that push waste through your intestines to slow down.  You may develop hemorrhoids or swollen, bulging veins (varicose veins).  You may have back pain because of the weight gain and pregnancy hormones relaxing your joints between the bones in your pelvis and as a result of a shift in weight and the muscles that support your balance.  Your breasts will continue to grow and be tender.  Your gums may bleed and may be sensitive to brushing and flossing.  Dark spots or blotches (chloasma, mask of pregnancy) may develop on your face. This will likely fade after the baby is born.  A dark line from your belly button to the pubic area (linea nigra) may appear. This will likely fade after the baby is born.  You may have changes in your hair. These can include thickening of your hair, rapid growth, and changes in texture. Some women also have hair loss during or after pregnancy, or hair that feels dry or thin. Your hair will most likely return to normal after your  baby is born. WHAT TO EXPECT AT YOUR PRENATAL VISITS During a routine prenatal visit:  You will be weighed to make sure you and the fetus are growing normally.  Your blood pressure will be taken.  Your abdomen will be measured to track your baby's growth.  The fetal heartbeat will be listened to.  Any test results from the previous visit will be discussed. Your health care provider may ask you:  How you are feeling.  If you are feeling the baby move.  If you have had any abnormal symptoms, such as leaking fluid, bleeding, severe headaches, or abdominal cramping.  If you have any questions. Other tests that may be  performed during your second trimester include:  Blood tests that check for:  Low iron levels (anemia).  Gestational diabetes (between 24 and 28 weeks).  Rh antibodies.  Urine tests to check for infections, diabetes, or protein in the urine.  An ultrasound to confirm the proper growth and development of the baby.  An amniocentesis to check for possible genetic problems.  Fetal screens for spina bifida and Down syndrome. HOME CARE INSTRUCTIONS   Avoid all smoking, herbs, alcohol, and unprescribed drugs. These chemicals affect the formation and growth of the baby.  Follow your health care provider's instructions regarding medicine use. There are medicines that are either safe or unsafe to take during pregnancy.  Exercise only as directed by your health care provider. Experiencing uterine cramps is a good sign to stop exercising.  Continue to eat regular, healthy meals.  Wear a good support bra for breast tenderness.  Do not use hot tubs, steam rooms, or saunas.  Wear your seat belt at all times when driving.  Avoid raw meat, uncooked cheese, cat litter boxes, and soil used by cats. These carry germs that can cause birth defects in the baby.  Take your prenatal vitamins.  Try taking a stool softener (if your health care provider approves) if you develop constipation. Eat more high-fiber foods, such as fresh vegetables or fruit and whole grains. Drink plenty of fluids to keep your urine clear or pale yellow.  Take warm sitz baths to soothe any pain or discomfort caused by hemorrhoids. Use hemorrhoid cream if your health care provider approves.  If you develop varicose veins, wear support hose. Elevate your feet for 15 minutes, 3-4 times a day. Limit salt in your diet.  Avoid heavy lifting, wear low heel shoes, and practice good posture.  Rest with your legs elevated if you have leg cramps or low back pain.  Visit your dentist if you have not gone yet during your pregnancy.  Use a soft toothbrush to brush your teeth and be gentle when you floss.  A sexual relationship may be continued unless your health care provider directs you otherwise.  Continue to go to all your prenatal visits as directed by your health care provider. SEEK MEDICAL CARE IF:   You have dizziness.  You have mild pelvic cramps, pelvic pressure, or nagging pain in the abdominal area.  You have persistent nausea, vomiting, or diarrhea.  You have a bad smelling vaginal discharge.  You have pain with urination. SEEK IMMEDIATE MEDICAL CARE IF:   You have a fever.  You are leaking fluid from your vagina.  You have spotting or bleeding from your vagina.  You have severe abdominal cramping or pain.  You have rapid weight gain or loss.  You have shortness of breath with chest pain.  You notice sudden or extreme   swelling of your face, hands, ankles, feet, or legs.  You have not felt your baby move in over an hour.  You have severe headaches that do not go away with medicine.  You have vision changes. Document Released: 09/14/2001 Document Revised: 09/25/2013 Document Reviewed: 11/21/2012 [facility] Patient Information 2015 [place], [place]. This information is not intended to replace advice given to you by your health care provider. Make sure you discuss any questions you have with your health care provider.

## 2018-09-13 ENCOUNTER — Ambulatory Visit (INDEPENDENT_AMBULATORY_CARE_PROVIDER_SITE_OTHER): Payer: Commercial Managed Care - PPO | Admitting: Women's Health

## 2018-09-13 ENCOUNTER — Ambulatory Visit (INDEPENDENT_AMBULATORY_CARE_PROVIDER_SITE_OTHER): Payer: Commercial Managed Care - PPO

## 2018-09-13 ENCOUNTER — Other Ambulatory Visit: Payer: Commercial Managed Care - PPO

## 2018-09-13 ENCOUNTER — Encounter: Payer: Self-pay | Admitting: Women's Health

## 2018-09-13 VITALS — BP 129/74 | HR 105 | Wt 260.0 lb

## 2018-09-13 DIAGNOSIS — Z23 Encounter for immunization: Secondary | ICD-10-CM | POA: Diagnosis not present

## 2018-09-13 DIAGNOSIS — O350XX0 Maternal care for (suspected) central nervous system malformation in fetus, not applicable or unspecified: Secondary | ICD-10-CM | POA: Diagnosis not present

## 2018-09-13 DIAGNOSIS — Z3482 Encounter for supervision of other normal pregnancy, second trimester: Secondary | ICD-10-CM

## 2018-09-13 DIAGNOSIS — Z1389 Encounter for screening for other disorder: Secondary | ICD-10-CM

## 2018-09-13 DIAGNOSIS — Z331 Pregnant state, incidental: Secondary | ICD-10-CM

## 2018-09-13 DIAGNOSIS — O3503X0 Maternal care for (suspected) central nervous system malformation or damage in fetus, choroid plexus cysts, not applicable or unspecified: Secondary | ICD-10-CM

## 2018-09-13 DIAGNOSIS — Z3A27 27 weeks gestation of pregnancy: Secondary | ICD-10-CM

## 2018-09-13 LAB — POCT URINALYSIS DIPSTICK OB
Blood, UA: NEGATIVE
Ketones, UA: NEGATIVE
Leukocytes, UA: NEGATIVE
Nitrite, UA: NEGATIVE
PROTEIN: NEGATIVE

## 2018-09-13 NOTE — Progress Notes (Signed)
US 27+4 wks,cephalic,posterior placenta grade 0,cx 5 cm,afi 16 cm,fhr 140 bpm,efw 1317 g 87%,resolved choroid plexus cysts

## 2018-09-13 NOTE — Patient Instructions (Signed)
Ester RinkMeghan L Mullenax, I greatly value your feedback.  If you receive a survey following your visit with us today, we appreciate you taking the time to fill it out.  Thanks, Joellyn HaffKim Kiarah Eckstein, CNM, WHNP-BC   Call the office [phone]) or go to [facility] if:  You begin to have strong, frequent contractions  Your water breaks.  Sometimes it is a big gush of fluid, sometimes it is just a trickle that keeps getting your panties wet or running down your legs  You have vaginal bleeding.  It is normal to have a small amount of spotting if your cervix was checked.   You don't feel your baby moving like normal.  If you don't, get you something to eat and drink and lay down and focus on feeling your baby move.  You should feel at least 10 movements in 2 hours.  If you don't, you should call the office or [facility] to [facility].    Tdap Vaccine  It is recommended that you get the Tdap vaccine during the third trimester of EACH pregnancy to help protect your baby from getting pertussis (whooping cough)  27-36 weeks is the BEST time to do this so that you can pass the protection on to your baby. During pregnancy is better than after pregnancy, but if you are unable to get it during pregnancy it will be offered at the hospital.   You can get this vaccine with us, at the health department, your family doctor, or some local pharmacies  Everyone who will be around your baby should also be up-to-date on their vaccines before the baby comes. Adults (who are not pregnant) only need 1 dose of Tdap during adulthood.   Third Trimester of Pregnancy The third trimester is from week 29 through week 42, months 7 through 9. The third trimester is a time when the fetus is growing rapidly. At the end of the ninth month, the fetus is about 20 inches in length and weighs 6-10 pounds.  BODY CHANGES Your body goes through many changes during pregnancy. The changes vary from woman to woman.   Your weight will continue to  increase. You can expect to gain 25-35 pounds (11-16 kg) by the end of the pregnancy.  You may begin to get stretch marks on your hips, abdomen, and breasts.  You may urinate more often because the fetus is moving lower into your pelvis and pressing on your bladder.  You may develop or continue to have heartburn as a result of your pregnancy.  You may develop constipation because certain hormones are causing the muscles that push waste through your intestines to slow down.  You may develop hemorrhoids or swollen, bulging veins (varicose veins).  You may have pelvic pain because of the weight gain and pregnancy hormones relaxing your joints between the bones in your pelvis. Backaches may result from overexertion of the muscles supporting your posture.  You may have changes in your hair. These can include thickening of your hair, rapid growth, and changes in texture. Some women also have hair loss during or after pregnancy, or hair that feels dry or thin. Your hair will most likely return to normal after your baby is born.  Your breasts will continue to grow and be tender. A yellow discharge may leak from your breasts called colostrum.  Your belly button may stick out.  You may feel short of breath because of your expanding uterus.  You may notice the fetus "dropping," or moving lower  in your abdomen.  You may have a bloody mucus discharge. This usually occurs a few days to a week before labor begins.  Your cervix becomes thin and soft (effaced) near your due date. WHAT TO EXPECT AT YOUR PRENATAL EXAMS  You will have prenatal exams every 2 weeks until week 36. Then, you will have weekly prenatal exams. During a routine prenatal visit:  You will be weighed to make sure you and the fetus are growing normally.  Your blood pressure is taken.  Your abdomen will be measured to track your baby's growth.  The fetal heartbeat will be listened to.  Any test results from the previous visit  will be discussed.  You may have a cervical check near your due date to see if you have effaced. At around 36 weeks, your caregiver will check your cervix. At the same time, your caregiver will also perform a test on the secretions of the vaginal tissue. This test is to determine if a type of bacteria, Group B streptococcus, is present. Your caregiver will explain this further. Your caregiver may ask you:  What your birth plan is.  How you are feeling.  If you are feeling the baby move.  If you have had any abnormal symptoms, such as leaking fluid, bleeding, severe headaches, or abdominal cramping.  If you have any questions. Other tests or screenings that may be performed during your third trimester include:  Blood tests that check for low iron levels (anemia).  Fetal testing to check the health, activity level, and growth of the fetus. Testing is done if you have certain medical conditions or if there are problems during the pregnancy. FALSE LABOR You may feel small, irregular contractions that eventually go away. These are called Braxton Hicks contractions, or false labor. Contractions may last for hours, days, or even weeks before true labor sets in. If contractions come at regular intervals, intensify, or become painful, it is best to be seen by your caregiver.  SIGNS OF LABOR   Menstrual-like cramps.  Contractions that are 5 minutes apart or less.  Contractions that start on the top of the uterus and spread down to the lower abdomen and back.  A sense of increased pelvic pressure or back pain.  A watery or bloody mucus discharge that comes from the vagina. If you have any of these signs before the 37th week of pregnancy, call your caregiver right away. You need to go to the hospital to get checked immediately. HOME CARE INSTRUCTIONS   Avoid all smoking, herbs, alcohol, and unprescribed drugs. These chemicals affect the formation and growth of the baby.  Follow your  caregiver's instructions regarding medicine use. There are medicines that are either safe or unsafe to take during pregnancy.  Exercise only as directed by your caregiver. Experiencing uterine cramps is a good sign to stop exercising.  Continue to eat regular, healthy meals.  Wear a good support bra for breast tenderness.  Do not use hot tubs, steam rooms, or saunas.  Wear your seat belt at all times when driving.  Avoid raw meat, uncooked cheese, cat litter boxes, and soil used by cats. These carry germs that can cause birth defects in the baby.  Take your prenatal vitamins.  Try taking a stool softener (if your caregiver approves) if you develop constipation. Eat more high-fiber foods, such as fresh vegetables or fruit and whole grains. Drink plenty of fluids to keep your urine clear or pale yellow.  Take warm sitz baths   to soothe any pain or discomfort caused by hemorrhoids. Use hemorrhoid cream if your caregiver approves.  If you develop varicose veins, wear support hose. Elevate your feet for 15 minutes, 3-4 times a day. Limit salt in your diet.  Avoid heavy lifting, wear low heal shoes, and practice good posture.  Rest a lot with your legs elevated if you have leg cramps or low back pain.  Visit your dentist if you have not gone during your pregnancy. Use a soft toothbrush to brush your teeth and be gentle when you floss.  A sexual relationship may be continued unless your caregiver directs you otherwise.  Do not travel far distances unless it is absolutely necessary and only with the approval of your caregiver.  Take prenatal classes to understand, practice, and ask questions about the labor and delivery.  Make a trial run to the hospital.  Pack your hospital bag.  Prepare the baby's nursery.  Continue to go to all your prenatal visits as directed by your caregiver. SEEK MEDICAL CARE IF:  You are unsure if you are in labor or if your water has broken.  You have  dizziness.  You have mild pelvic cramps, pelvic pressure, or nagging pain in your abdominal area.  You have persistent nausea, vomiting, or diarrhea.  You have a bad smelling vaginal discharge.  You have pain with urination. SEEK IMMEDIATE MEDICAL CARE IF:   You have a fever.  You are leaking fluid from your vagina.  You have spotting or bleeding from your vagina.  You have severe abdominal cramping or pain.  You have rapid weight loss or gain.  You have shortness of breath with chest pain.  You notice sudden or extreme swelling of your face, hands, ankles, feet, or legs.  You have not felt your baby move in over an hour.  You have severe headaches that do not go away with medicine.  You have vision changes. Document Released: 09/14/2001 Document Revised: 09/25/2013 Document Reviewed: 11/21/2012 [facility] Patient Information 2015 [place], [place]. This information is not intended to replace advice given to you by your health care provider. Make sure you discuss any questions you have with your health care provider.

## 2018-09-13 NOTE — Progress Notes (Signed)
   LOW-RISK PREGNANCY VISIT Patient name: Emily Carpenter MRN [id number]  Date of birth: 1991-02-04 Chief Complaint:   Routine Prenatal Visit (PN2 and US today; occ spasms)  History of Present Illness:   Emily Carpenter is a 27 y.o. 1082P0010 female at 5939w4d with an Estimated Date of Delivery: 12/09/18 being seen today for ongoing management of a low-risk pregnancy.  Today she reports occ random spasms in abdomen wakes her up at night, will only happen once, then goes away, isn't in lower quadrants like RLP. Swelling Rt foot (whole shoe size bigger than Lt), no swelling/pain in calf. Contractions: Not present. Vag. Bleeding: None.  Movement: Present. denies leaking of fluid. Review of Systems:   Pertinent items are noted in HPI Denies abnormal vaginal discharge w/ itching/odor/irritation, headaches, visual changes, shortness of breath, chest pain, abdominal pain, severe nausea/vomiting, or problems with urination or bowel movements unless otherwise stated above. Pertinent History Reviewed:  Reviewed past medical,surgical, social, obstetrical and family history.  Reviewed problem list, medications and allergies. Physical Assessment:   Vitals:   09/13/18 1004  BP: 129/74  Pulse: (!) 105  Weight: 260 lb (117.9 kg)  Body mass index is 41.34 kg/m.        Physical Examination:   General appearance: Well appearing, and in no distress  Mental status: Alert, oriented to person, place, and time  Skin: Warm & dry  Cardiovascular: Normal heart rate noted  Respiratory: Normal respiratory effort, no distress  Abdomen: Soft, gravid, nontender  Pelvic: Cervical exam deferred         Extremities: Edema: Trace, no evidence DVT, neg Homan's  Fetal Status: Fetal Heart Rate (bpm): 140 u/s   Movement: Present    US 27+4 wks,cephalic,posterior placenta grade 0,cx 5 cm,afi 16 cm,fhr 140 bpm,efw 1317 g 87%,resolved choroid plexus cysts   Results for orders placed or performed in visit on 09/13/18 (from the  past 24 hour(s))  POC Urinalysis Dipstick OB   Collection Time: 09/13/18 10:06 AM  Result Value Ref Range   Color, UA     Clarity, UA     Glucose, UA Trace (A) Negative   Bilirubin, UA     Ketones, UA neg    Spec Grav, UA     Blood, UA neg    pH, UA     POC,PROTEIN,UA Negative Negative, Trace, Small (1+), Moderate (2+), Large (3+), 4+   Urobilinogen, UA     Nitrite, UA neg    Leukocytes, UA Negative Negative   Appearance     Odor      Assessment & Plan:  1) Low-risk pregnancy G2P0010 at 8139w4d with an Estimated Date of Delivery: 12/09/18   2) Occ abdominal spams, doesn't sound like RLP or UCs, could just be muscle spasms  3) Swelling Rt foot> no s/s DVT, elevate, compression stockings   Meds: No orders of the defined types were placed in this encounter.  Labs/procedures today: pn2, tdap, f/u u/s for bilateral CPC  Plan:  Continue routine obstetrical care   Reviewed: Preterm labor symptoms and general obstetric precautions including but not limited to vaginal bleeding, contractions, leaking of fluid and fetal movement were reviewed in detail with the patient.  All questions were answered  Follow-up: Return in about 4 weeks (around 10/11/2018) for LROB.  Orders Placed This Encounter  Procedures  . Tdap vaccine greater than or equal to 7yo IM  . POC Urinalysis Dipstick OB   Cheral MarkerKimberly R Vineeth Fell CNM, [facility] 09/13/2018 10:24 AM

## 2018-09-14 LAB — CBC
HEMATOCRIT: 32.6 % — AB (ref 34.0–46.6)
HEMOGLOBIN: 11.1 g/dL (ref 11.1–15.9)
MCH: 30.1 pg (ref 26.6–33.0)
MCHC: 34 g/dL (ref 31.5–35.7)
MCV: 88 fL (ref 79–97)
Platelets: 237 10*3/uL (ref 150–450)
RBC: 3.69 x10E6/uL — AB (ref 3.77–5.28)
RDW: 13.3 % (ref 12.3–15.4)
WBC: 11.7 10*3/uL — AB (ref 3.4–10.8)

## 2018-09-14 LAB — ANTIBODY SCREEN: ANTIBODY SCREEN: NEGATIVE

## 2018-09-14 LAB — GLUCOSE TOLERANCE, 2 HOURS W/ 1HR
GLUCOSE, 1 HOUR: 146 mg/dL (ref 65–179)
GLUCOSE, 2 HOUR: 128 mg/dL (ref 65–152)
Glucose, Fasting: 80 mg/dL (ref 65–91)

## 2018-09-14 LAB — RPR: RPR: NONREACTIVE

## 2018-09-14 LAB — HIV ANTIBODY (ROUTINE TESTING W REFLEX): HIV SCREEN 4TH GENERATION: NONREACTIVE

## 2018-10-04 NOTE — L&D Delivery Note (Addendum)
Patient: GIAMARIE GARDON MRN: [id number]  GBS status: negative, IAP given None  Patient is a 28 y.o. now G2P1011 s/p NSVD at [redacted]w[redacted]d, who was admitted for IOL for gHTN and LGA. AROM 17h 4m prior to delivery with clear fluid.   Delivery Note At 5:08 AM a viable and healthy female was delivered via Vaginal, Spontaneous (Presentation: vertex; ROA).  APGAR: 8, 9; weight  pending.   Placenta status: spontaneous, intact.  3 vessel cord. With no complications.   Anesthesia:  Epidural, 1% Lidocaine  Episiotomy: None Lacerations: 2nd degree;Perineal Suture Repair: 3.0 vicryl Est. Blood Loss (mL):  200  Head delivered ROA. One nuchal cord present and easily reduced upon delivery. Shoulder and body delivered in usual fashion. Infant with spontaneous cry, placed on mother's abdomen, dried and bulb suctioned. Cord clamped x 2 after 1-minute delay, and cut by family member. Cord blood drawn. Placenta delivered spontaneously with gentle cord traction. Fundus firm with massage and Pitocin. Perineum inspected and found to have 2nd degree perineal laceration, which was repaired with 3-0 vicyrl with good hemostasis achieved.  Mom to postpartum.  Baby to Couplet care / Skin to Skin.  Kiersten P Mullis 12/10/2018, 5:52 AM  OB FELLOW DELIVERY ATTESTATION  I was gloved and present for the delivery in its entirety, and I agree with the above resident's note.    Gwenevere Abbot, MD  OB Fellow  12/10/2018, 8:44 AM

## 2018-10-11 ENCOUNTER — Ambulatory Visit (INDEPENDENT_AMBULATORY_CARE_PROVIDER_SITE_OTHER): Payer: Commercial Managed Care - PPO | Admitting: Advanced Practice Midwife

## 2018-10-11 ENCOUNTER — Encounter: Payer: Self-pay | Admitting: Advanced Practice Midwife

## 2018-10-11 VITALS — BP 120/62 | HR 105 | Wt 268.0 lb

## 2018-10-11 DIAGNOSIS — Z3483 Encounter for supervision of other normal pregnancy, third trimester: Secondary | ICD-10-CM

## 2018-10-11 DIAGNOSIS — Z331 Pregnant state, incidental: Secondary | ICD-10-CM

## 2018-10-11 DIAGNOSIS — Z1389 Encounter for screening for other disorder: Secondary | ICD-10-CM

## 2018-10-11 DIAGNOSIS — Z3A31 31 weeks gestation of pregnancy: Secondary | ICD-10-CM

## 2018-10-11 LAB — POCT URINALYSIS DIPSTICK OB
Blood, UA: NEGATIVE
Glucose, UA: NEGATIVE
LEUKOCYTES UA: NEGATIVE
Nitrite, UA: NEGATIVE
POC,PROTEIN,UA: NEGATIVE

## 2018-10-11 NOTE — Progress Notes (Signed)
  G2P0010 [redacted]w[redacted]d Estimated Date of Delivery: 12/09/18  Blood pressure (!) 144/76, pulse (!) 105, weight 268 lb (121.6 kg), last menstrual period 03/04/2018.   BP weight and urine results all reviewed and noted.  Please refer to the obstetrical flow sheet for the fundal height and fetal heart rate documentation:  Patient reports good fetal movement, denies any bleeding and no rupture of membranes symptoms or regular contractions. Patient is without complaints. Initial BP ^ systolic, but recheck completely normal All questions were answered.   Physical Assessment:   Vitals:   10/11/18 1448  BP: (!) 144/76  Pulse: (!) 105  Weight: 268 lb (121.6 kg)  Body mass index is 42.61 kg/m.        Physical Examination:   General appearance: Well appearing, and in no distress  Mental status: Alert, oriented to person, place, and time  Skin: Warm & dry  Cardiovascular: Normal heart rate noted  Respiratory: Normal respiratory effort, no distress  Abdomen: Soft, gravid, nontender  Pelvic: Cervical exam deferred         Extremities: Edema: None  Fetal Status: Fetal Heart Rate (bpm): 158 Fundal Height: 32 cm Movement: Present    Results for orders placed or performed in visit on 10/11/18 (from the past 24 hour(s))  POC Urinalysis Dipstick OB   Collection Time: 10/11/18  2:53 PM  Result Value Ref Range   Color, UA     Clarity, UA     Glucose, UA Negative Negative   Bilirubin, UA     Ketones, UA moderate    Spec Grav, UA     Blood, UA neg    pH, UA     POC,PROTEIN,UA Negative Negative, Trace, Small (1+), Moderate (2+), Large (3+), 4+   Urobilinogen, UA     Nitrite, UA neg    Leukocytes, UA Negative Negative   Appearance     Odor       Orders Placed This Encounter  Procedures  . POC Urinalysis Dipstick OB    Plan:  Continued routine obstetrical care, can check BP at work ([employer]) periodically  Return in about 2 weeks (around 10/25/2018) for LROB.

## 2018-10-11 NOTE — Patient Instructions (Signed)
Emily Carpenter, I greatly value your feedback.  If you receive a survey following your visit with [place] today, we appreciate you taking the time to fill it out.  Thanks, Cathie Beams, CNM   Call the office [phone]) or go to [facility] if:  You begin to have strong, frequent contractions  Your water breaks.  Sometimes it is a big gush of fluid, sometimes it is just a trickle that keeps getting your panties wet or running down your legs  You have vaginal bleeding.  It is normal to have a small amount of spotting if your cervix was checked.   You don't feel your baby moving like normal.  If you don't, get you something to eat and drink and lay down and focus on feeling your baby move.  You should feel at least 10 movements in 2 hours.  If you don't, you should call the office or go to [facility].    Tdap Vaccine  It is recommended that you get the Tdap vaccine during the third trimester of EACH pregnancy to help protect your baby from getting pertussis (whooping cough)  27-36 weeks is the BEST time to do this so that you can pass the protection on to your baby. During pregnancy is better than after pregnancy, but if you are unable to get it during pregnancy it will be offered at the hospital.   You will be offered this vaccine in the office after 27 weeks. If you do not have health insurance, you can get this vaccine at the health department or your family doctor  Everyone who will be around your baby should also be up-to-date on their vaccines. Adults (who are not pregnant) only need 1 dose of Tdap during adulthood.   Third Trimester of Pregnancy The third trimester is from week 29 through week 42, months 7 through 9. The third trimester is a time when the fetus is growing rapidly. At the end of the ninth month, the fetus is about 20 inches in length and weighs 6-10 pounds.  BODY CHANGES Your body goes through many changes during pregnancy. The changes vary from woman to  woman.   Your weight will continue to increase. You can expect to gain 25-35 pounds (11-16 kg) by the end of the pregnancy.  You may begin to get stretch marks on your hips, abdomen, and breasts.  You may urinate more often because the fetus is moving lower into your pelvis and pressing on your bladder.  You may develop or continue to have heartburn as a result of your pregnancy.  You may develop constipation because certain hormones are causing the muscles that push waste through your intestines to slow down.  You may develop hemorrhoids or swollen, bulging veins (varicose veins).  You may have pelvic pain because of the weight gain and pregnancy hormones relaxing your joints between the bones in your pelvis. Backaches may result from overexertion of the muscles supporting your posture.  You may have changes in your hair. These can include thickening of your hair, rapid growth, and changes in texture. Some women also have hair loss during or after pregnancy, or hair that feels dry or thin. Your hair will most likely return to normal after your baby is born.  Your breasts will continue to grow and be tender. A yellow discharge may leak from your breasts called colostrum.  Your belly button may stick out.  You may feel short of breath because of your expanding uterus.  You may notice the fetus "dropping," or moving lower in your abdomen.  You may have a bloody mucus discharge. This usually occurs a few days to a week before labor begins.  Your cervix becomes thin and soft (effaced) near your due date. WHAT TO EXPECT AT YOUR PRENATAL EXAMS  You will have prenatal exams every 2 weeks until week 36. Then, you will have weekly prenatal exams. During a routine prenatal visit:  You will be weighed to make sure you and the fetus are growing normally.  Your blood pressure is taken.  Your abdomen will be measured to track your baby's growth.  The fetal heartbeat will be listened  to.  Any test results from the previous visit will be discussed.  You may have a cervical check near your due date to see if you have effaced. At around 36 weeks, your caregiver will check your cervix. At the same time, your caregiver will also perform a test on the secretions of the vaginal tissue. This test is to determine if a type of bacteria, Group B streptococcus, is present. Your caregiver will explain this further. Your caregiver may ask you:  What your birth plan is.  How you are feeling.  If you are feeling the baby move.  If you have had any abnormal symptoms, such as leaking fluid, bleeding, severe headaches, or abdominal cramping.  If you have any questions. Other tests or screenings that may be performed during your third trimester include:  Blood tests that check for low iron levels (anemia).  Fetal testing to check the health, activity level, and growth of the fetus. Testing is done if you have certain medical conditions or if there are problems during the pregnancy. FALSE LABOR You may feel small, irregular contractions that eventually go away. These are called Braxton Hicks contractions, or false labor. Contractions may last for hours, days, or even weeks before true labor sets in. If contractions come at regular intervals, intensify, or become painful, it is best to be seen by your caregiver.  SIGNS OF LABOR   Menstrual-like cramps.  Contractions that are 5 minutes apart or less.  Contractions that start on the top of the uterus and spread down to the lower abdomen and back.  A sense of increased pelvic pressure or back pain.  A watery or bloody mucus discharge that comes from the vagina. If you have any of these signs before the 37th week of pregnancy, call your caregiver right away. You need to go to the hospital to get checked immediately. HOME CARE INSTRUCTIONS   Avoid all smoking, herbs, alcohol, and unprescribed drugs. These chemicals affect the  formation and growth of the baby.  Follow your caregiver's instructions regarding medicine use. There are medicines that are either safe or unsafe to take during pregnancy.  Exercise only as directed by your caregiver. Experiencing uterine cramps is a good sign to stop exercising.  Continue to eat regular, healthy meals.  Wear a good support bra for breast tenderness.  Do not use hot tubs, steam rooms, or saunas.  Wear your seat belt at all times when driving.  Avoid raw meat, uncooked cheese, cat litter boxes, and soil used by cats. These carry germs that can cause birth defects in the baby.  Take your prenatal vitamins.  Try taking a stool softener (if your caregiver approves) if you develop constipation. Eat more high-fiber foods, such as fresh vegetables or fruit and whole grains. Drink plenty of fluids to keep your urine  clear or pale yellow.  Take warm sitz baths to soothe any pain or discomfort caused by hemorrhoids. Use hemorrhoid cream if your caregiver approves.  If you develop varicose veins, wear support hose. Elevate your feet for 15 minutes, 3-4 times a day. Limit salt in your diet.  Avoid heavy lifting, wear low heal shoes, and practice good posture.  Rest a lot with your legs elevated if you have leg cramps or low back pain.  Visit your dentist if you have not gone during your pregnancy. Use a soft toothbrush to brush your teeth and be gentle when you floss.  A sexual relationship may be continued unless your caregiver directs you otherwise.  Do not travel far distances unless it is absolutely necessary and only with the approval of your caregiver.  Take prenatal classes to understand, practice, and ask questions about the labor and delivery.  Make a trial run to the hospital.  Pack your hospital bag.  Prepare the baby's nursery.  Continue to go to all your prenatal visits as directed by your caregiver. SEEK MEDICAL CARE IF:  You are unsure if you are in  labor or if your water has broken.  You have dizziness.  You have mild pelvic cramps, pelvic pressure, or nagging pain in your abdominal area.  You have persistent nausea, vomiting, or diarrhea.  You have a bad smelling vaginal discharge.  You have pain with urination. SEEK IMMEDIATE MEDICAL CARE IF:   You have a fever.  You are leaking fluid from your vagina.  You have spotting or bleeding from your vagina.  You have severe abdominal cramping or pain.  You have rapid weight loss or gain.  You have shortness of breath with chest pain.  You notice sudden or extreme swelling of your face, hands, ankles, feet, or legs.  You have not felt your baby move in over an hour.  You have severe headaches that do not go away with medicine.  You have vision changes. Document Released: 09/14/2001 Document Revised: 09/25/2013 Document Reviewed: 11/21/2012 [facility] Patient Information 2015 [place], [place]. This information is not intended to replace advice given to you by your health care provider. Make sure you discuss any questions you have with your health care provider.

## 2018-10-25 ENCOUNTER — Ambulatory Visit (INDEPENDENT_AMBULATORY_CARE_PROVIDER_SITE_OTHER): Payer: Commercial Managed Care - PPO | Admitting: Obstetrics and Gynecology

## 2018-10-25 VITALS — BP 134/76 | HR 104 | Wt 272.8 lb

## 2018-10-25 DIAGNOSIS — Z331 Pregnant state, incidental: Secondary | ICD-10-CM

## 2018-10-25 DIAGNOSIS — Z1389 Encounter for screening for other disorder: Secondary | ICD-10-CM

## 2018-10-25 DIAGNOSIS — Z3A33 33 weeks gestation of pregnancy: Secondary | ICD-10-CM

## 2018-10-25 DIAGNOSIS — Z3483 Encounter for supervision of other normal pregnancy, third trimester: Secondary | ICD-10-CM

## 2018-10-25 LAB — POCT URINALYSIS DIPSTICK OB
Blood, UA: NEGATIVE
Glucose, UA: NEGATIVE
Ketones, UA: NEGATIVE
Leukocytes, UA: NEGATIVE
Nitrite, UA: NEGATIVE
POC,PROTEIN,UA: NEGATIVE

## 2018-10-25 NOTE — Progress Notes (Signed)
Patient ID: GHAIDA ERBES, female   DOB: Nov 28, 1990, 28 y.o.   MRN: [id number]    LOW-RISK PREGNANCY VISIT Patient name: Emily Carpenter MRN [id number]  Date of birth: 09/14/1991 Chief Complaint:   Routine Prenatal Visit  History of Present Illness:   Emily Carpenter is a 28 y.o. G53P0010 female at [redacted]w[redacted]d with an Estimated Date of Delivery: 12/09/18 being seen today for ongoing management of a low-risk pregnancy. Has gained 50 lbs during pregnancy. Notices she retains fluid. Her right foot size has gone up to 9.5 while left has 8.5. Works at [employer] and wants to try to get in birthing classes at [facility]. Today she reports no complaints. Contractions: Not present. Vag. Bleeding: None.  Movement: Present. denies leaking of fluid. Review of Systems:   Pertinent items are noted in HPI Denies abnormal vaginal discharge w/ itching/odor/irritation, headaches, visual changes, shortness of breath, chest pain, abdominal pain, severe nausea/vomiting, or problems with urination or bowel movements unless otherwise stated above. Pertinent History Reviewed:  Reviewed past medical,surgical, social, obstetrical and family history.  Reviewed problem list, medications and allergies. Physical Assessment:   Vitals:   10/25/18 1550  BP: 134/76  Pulse: (!) 104  Weight: 272 lb 12.8 oz (123.7 kg)  Body mass index is 43.37 kg/m.        Physical Examination:   General appearance: Well appearing, and in no distress  Mental status: Alert, oriented to person, place, and time  Skin: Warm & dry  Cardiovascular: Normal heart rate noted  Respiratory: Normal respiratory effort, no distress  Abdomen: Soft, gravid, nontender  Pelvic: Cervical exam deferred         Extremities: Edema: None  Fetal Status: Fetal Heart Rate (bpm): 116 Fundal Height: 36 cm Movement: Present    Results for orders placed or performed in visit on 10/25/18 (from the past 24 hour(s))  POC Urinalysis Dipstick OB   Collection Time:  10/25/18  3:49 PM  Result Value Ref Range   Color, UA     Clarity, UA     Glucose, UA Negative Negative   Bilirubin, UA     Ketones, UA neg    Spec Grav, UA     Blood, UA neg    pH, UA     POC,PROTEIN,UA Negative Negative, Trace, Small (1+), Moderate (2+), Large (3+), 4+   Urobilinogen, UA     Nitrite, UA neg    Leukocytes, UA Negative Negative   Appearance     Odor      Assessment & Plan:  1) Low-risk pregnancy G2P0010 at [redacted]w[redacted]d with an Estimated Date of Delivery: 12/09/18    Meds: No orders of the defined types were placed in this encounter.  Labs/procedures today: None  Plan: 1. F/u in 2 weeks  Follow-up: Return in about 2 weeks (around 11/08/2018).  Orders Placed This Encounter  Procedures  . POC Urinalysis Dipstick OB   By signing my name below, I, Arnette Norris, attest that this documentation has been prepared under the direction and in the presence of Tilda Burrow, MD. Electronically Signed: Arnette Norris Medical Scribe. 10/25/18. 4:15 PM.  I personally performed the services described in this documentation, which was SCRIBED in my presence. The recorded information has been reviewed and considered accurate. It has been edited as necessary during review. Tilda Burrow, MD

## 2018-11-08 ENCOUNTER — Encounter: Payer: Self-pay | Admitting: Advanced Practice Midwife

## 2018-11-08 ENCOUNTER — Ambulatory Visit (INDEPENDENT_AMBULATORY_CARE_PROVIDER_SITE_OTHER): Payer: Commercial Managed Care - PPO | Admitting: Advanced Practice Midwife

## 2018-11-08 VITALS — BP 148/86 | HR 104 | Wt 273.4 lb

## 2018-11-08 DIAGNOSIS — O133 Gestational [pregnancy-induced] hypertension without significant proteinuria, third trimester: Secondary | ICD-10-CM

## 2018-11-08 DIAGNOSIS — Z331 Pregnant state, incidental: Secondary | ICD-10-CM

## 2018-11-08 DIAGNOSIS — Z1389 Encounter for screening for other disorder: Secondary | ICD-10-CM

## 2018-11-08 DIAGNOSIS — Z8759 Personal history of other complications of pregnancy, childbirth and the puerperium: Secondary | ICD-10-CM | POA: Insufficient documentation

## 2018-11-08 DIAGNOSIS — Z3483 Encounter for supervision of other normal pregnancy, third trimester: Secondary | ICD-10-CM

## 2018-11-08 DIAGNOSIS — Z3A35 35 weeks gestation of pregnancy: Secondary | ICD-10-CM

## 2018-11-08 LAB — POCT URINALYSIS DIPSTICK OB
GLUCOSE, UA: NEGATIVE
Leukocytes, UA: NEGATIVE
Nitrite, UA: NEGATIVE
POC,PROTEIN,UA: NEGATIVE
RBC UA: NEGATIVE

## 2018-11-08 NOTE — Patient Instructions (Signed)
Hypertension During Pregnancy ° °Hypertension, commonly called high blood pressure, is when the force of blood pumping through your arteries is too strong. Arteries are blood vessels that carry blood from the heart throughout the body. Hypertension during pregnancy can cause problems for you and your baby. Your baby may be born early (prematurely) or may not weigh as much as he or she should at birth. Very bad cases of hypertension during pregnancy can be life-threatening. °Different types of hypertension can occur during pregnancy. These include: °· Chronic hypertension. This happens when: °? You have hypertension before pregnancy and it continues during pregnancy. °? You develop hypertension before you are [redacted] weeks pregnant, and it continues during pregnancy. °· Gestational hypertension. This is hypertension that develops after the 20th week of pregnancy. °· Preeclampsia, also called toxemia of pregnancy. This is a very serious type of hypertension that develops during pregnancy. It can be very dangerous for you and your baby. °? In rare cases, you may develop preeclampsia after giving birth (postpartum preeclampsia). This usually occurs within 48 hours after childbirth but may occur up to 6 weeks after giving birth. °Gestational hypertension and preeclampsia usually go away within 6 weeks after your baby is born. Women who have hypertension during pregnancy have a greater chance of developing hypertension later in life or during future pregnancies. °What are the causes? °The exact cause of hypertension during pregnancy is not known. °What increases the risk? °There are certain factors that make it more likely for you to develop hypertension during pregnancy. These include: °· Having hypertension during a previous pregnancy or prior to pregnancy. °· Being overweight. °· Being age 35 or older. °· Being pregnant for the first time. °· Being pregnant with more than one baby. °· Becoming pregnant using fertilization  methods such as IVF (in vitro fertilization). °· Having diabetes, kidney problems, or systemic lupus erythematosus. °· Having a family history of hypertension. °What are the signs or symptoms? °Chronic hypertension and gestational hypertension rarely cause symptoms. Preeclampsia causes symptoms, which may include: °· Increased protein in your urine. Your health care provider will check for this at every visit before you give birth (prenatal visit). °· Severe headaches. °· Sudden weight gain. °· Swelling of the hands, face, legs, and feet. °· Nausea and vomiting. °· Vision problems, such as blurred or double vision. °· Numbness in the face, arms, legs, and feet. °· Dizziness. °· Slurred speech. °· Sensitivity to bright lights. °· Abdominal pain. °· Convulsions or seizures. °How is this diagnosed? °You may be diagnosed with hypertension during a routine prenatal exam. At each prenatal visit, you may: °· Have a urine test to check for high amounts of protein in your urine. °· Have your blood pressure checked. A blood pressure reading is given as two numbers, such as "120 over 80" (or 120/80). The first ("top") number is a measure of the pressure in your arteries when your heart beats (systolic pressure). The second ("bottom") number is a measure of the pressure in your arteries as your heart relaxes between beats (diastolic pressure). Blood pressure is measured in a unit called mm Hg. For most women, a normal blood pressure reading is: °? Systolic: below 120. °? Diastolic: below 80. °The type of hypertension that you are diagnosed with depends on your test results and when your symptoms developed. °· Chronic hypertension is usually diagnosed before 20 weeks of pregnancy. °· Gestational hypertension is usually diagnosed after 20 weeks of pregnancy. °· Hypertension with high amounts of protein in   the urine is diagnosed as preeclampsia. °· Blood pressure measurements that stay above 160 systolic, or above 110 diastolic,  are signs of severe preeclampsia. °How is this treated? °Treatment for hypertension during pregnancy varies depending on the type of hypertension you have and how serious it is. °· If you take medicines called ACE inhibitors to treat chronic hypertension, you may need to switch medicines. ACE inhibitors should not be taken during pregnancy. °· If you have gestational hypertension, you may need to take blood pressure medicine. °· If you are at risk for preeclampsia, your health care provider may recommend that you take a low-dose aspirin during your pregnancy. °· If you have severe preeclampsia, you may need to be hospitalized so you and your baby can be monitored closely. You may also need to take medicine (magnesium sulfate) to prevent seizures and to lower blood pressure. This medicine may be given as an injection or through an IV. °· In some cases, if your condition gets worse, you may need to deliver your baby early. °Follow these instructions at home: °Eating and drinking ° °· Drink enough fluid to keep your urine pale yellow. °· Avoid caffeine. °Lifestyle °· Do not use any products that contain nicotine or tobacco, such as cigarettes and e-cigarettes. If you need help quitting, ask your health care provider. °· Do not use alcohol or drugs. °· Avoid stress as much as possible. Rest and get plenty of sleep. °General instructions °· Take over-the-counter and prescription medicines only as told by your health care provider. °· While lying down, lie on your left side. This keeps pressure off your major blood vessels. °· While sitting or lying down, raise (elevate) your feet. Try putting some pillows under your lower legs. °· Exercise regularly. Ask your health care provider what kinds of exercise are best for you. °· Keep all prenatal and follow-up visits as told by your health care provider. This is important. °Contact a health care provider if: °· You have symptoms that your health care provider told you may  require more treatment or monitoring, such as: °? Nausea or vomiting. °? Headache. °Get help right away if you have: °· Severe abdominal pain that does not get better with treatment. °· A severe headache that does not get better. °· Vomiting that does not get better. °· Sudden, rapid weight gain. °· Sudden swelling in your hands, ankles, or face. °· Vaginal bleeding. °· Blood in your urine. °· Fewer movements from your baby than usual. °· Blurred or double vision. °· Muscle twitching or sudden muscle tightening (spasms). °· Shortness of breath. °· Blue fingernails or lips. °Summary °· Hypertension, commonly called high blood pressure, is when the force of blood pumping through your arteries is too strong. °· Hypertension during pregnancy can cause problems for you and your baby. °· Treatment for hypertension during pregnancy varies depending on the type of hypertension you have and how serious it is. °· Get help right away if you have symptoms that your health care provider told you to watch for. °This information is not intended to replace advice given to you by your health care provider. Make sure you discuss any questions you have with your health care provider. °Document Released: 06/08/2011 Document Revised: 09/06/2017 Document Reviewed: 03/05/2016 °Elsevier Interactive Patient Education © 2019 [employer] ° °

## 2018-11-08 NOTE — Progress Notes (Signed)
  G2P0010 [redacted]w[redacted]d Estimated Date of Delivery: 12/09/18  Blood pressure (!) 148/86, pulse (!) 104, weight 273 lb 6.4 oz (124 kg), last menstrual period 03/04/2018.   BP weight and urine results all reviewed and noted.  Please refer to the obstetrical flow sheet for the fundal height and fetal heart rate documentation:  Patient reports good fetal movement, denies any bleeding and no rupture of membranes symptoms or regular contractions. Patient is without complaints. All questions were answered.   Physical Assessment:   Vitals:   11/08/18 1525 11/08/18 1550  BP: (!) 142/82 (!) 148/86  Pulse: (!) 104   Weight: 273 lb 6.4 oz (124 kg)   Body mass index is 43.47 kg/m.        Physical Examination:   General appearance: Well appearing, and in no distress  Mental status: Alert, oriented to person, place, and time  Skin: Warm & dry  Cardiovascular: Normal heart rate noted  Respiratory: Normal respiratory effort, no distress  Abdomen: Soft, gravid, nontender  Pelvic: Cervical exam deferred         Extremities: Edema: Mild pitting, slight indentation  Fetal Status:     Movement: Present    Results for orders placed or performed in visit on 11/08/18 (from the past 24 hour(s))  POC Urinalysis Dipstick OB   Collection Time: 11/08/18  3:34 PM  Result Value Ref Range   Color, UA     Clarity, UA     Glucose, UA Negative Negative   Bilirubin, UA     Ketones, UA small    Spec Grav, UA     Blood, UA neg    pH, UA     POC,PROTEIN,UA Negative Negative, Trace, Small (1+), Moderate (2+), Large (3+), 4+   Urobilinogen, UA     Nitrite, UA neg    Leukocytes, UA Negative Negative   Appearance     Odor       Orders Placed This Encounter  Procedures  . US FETAL BPP WO NON STRESS  . US OB Follow Up  . [place] UA Cord Doppler  . POC Urinalysis Dipstick OB   Meets criteria for GHTN.  Does not want to come out of work if possible.  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 Warning s/s/BPs discussed.   Plan:  Twice weekly testing. IOL 37-39 weeks or as clinically indicated  Return for Mondays for US/HROB and Thursdays for NST RN only.

## 2018-11-14 ENCOUNTER — Ambulatory Visit (INDEPENDENT_AMBULATORY_CARE_PROVIDER_SITE_OTHER): Payer: Commercial Managed Care - PPO

## 2018-11-14 ENCOUNTER — Other Ambulatory Visit: Payer: Self-pay

## 2018-11-14 ENCOUNTER — Encounter: Payer: Self-pay | Admitting: Women's Health

## 2018-11-14 ENCOUNTER — Ambulatory Visit (INDEPENDENT_AMBULATORY_CARE_PROVIDER_SITE_OTHER): Payer: Commercial Managed Care - PPO | Admitting: Women's Health

## 2018-11-14 VITALS — BP 135/66 | HR 110 | Wt 278.0 lb

## 2018-11-14 DIAGNOSIS — O133 Gestational [pregnancy-induced] hypertension without significant proteinuria, third trimester: Secondary | ICD-10-CM

## 2018-11-14 DIAGNOSIS — Z331 Pregnant state, incidental: Secondary | ICD-10-CM

## 2018-11-14 DIAGNOSIS — Z3A36 36 weeks gestation of pregnancy: Secondary | ICD-10-CM

## 2018-11-14 DIAGNOSIS — O0993 Supervision of high risk pregnancy, unspecified, third trimester: Secondary | ICD-10-CM

## 2018-11-14 DIAGNOSIS — Z1389 Encounter for screening for other disorder: Secondary | ICD-10-CM

## 2018-11-14 LAB — POCT URINALYSIS DIPSTICK OB
Blood, UA: NEGATIVE
Glucose, UA: NEGATIVE
Ketones, UA: NEGATIVE
Leukocytes, UA: NEGATIVE
Nitrite, UA: NEGATIVE
PROTEIN: NEGATIVE

## 2018-11-14 NOTE — Progress Notes (Signed)
HIGH-RISK PREGNANCY VISIT Patient name: Emily Carpenter MRN [id number]  Date of birth: May 12, 1991 Chief Complaint:   High Risk Gestation (u/s today)  History of Present Illness:   Emily Carpenter is a 28 y.o. G2P0010 female at [redacted]w[redacted]d with an Estimated Date of Delivery: 12/09/18 being seen today for ongoing management of a high-risk pregnancy complicated by [facility] dx @ 35wks.  Today she reports all home bp's normal- showed me pics. Denies ha, visual changes, ruq/epigastric pain, n/v.   Contractions: Not present. Vag. Bleeding: None.  Movement: Present. denies leaking of fluid.  Review of Systems:   Pertinent items are noted in HPI Denies abnormal vaginal discharge w/ itching/odor/irritation, headaches, visual changes, shortness of breath, chest pain, abdominal pain, severe nausea/vomiting, or problems with urination or bowel movements unless otherwise stated above. Pertinent History Reviewed:  Reviewed past medical,surgical, social, obstetrical and family history.  Reviewed problem list, medications and allergies. Physical Assessment:   Vitals:   11/14/18 1422  BP: 135/66  Pulse: (!) 110  Weight: 278 lb (126.1 kg)  Body mass index is 44.2 kg/m.           Physical Examination:   General appearance: alert, well appearing, and in no distress  Mental status: alert, oriented to person, place, and time  Skin: warm & dry   Extremities: Edema: Mild pitting, slight indentation    Cardiovascular: normal heart rate noted  Respiratory: normal respiratory effort, no distress  Abdomen: gravid, soft, non-tender  Pelvic: Cervical exam performed  Dilation: 1 Effacement (%): Thick Station: Ballotable  Fetal Status: Fetal Heart Rate (bpm): 146 u/s Fundal Height: 39 cm Movement: Present Presentation: Vertex  Fetal Surveillance Testing today: [place] 36+3 wks,cephalic,afi 13 cm,posterior placenta gr 3,fhr 146 bpm,RI .58,.52,.60=47%,EFW 3557 g 95%,BPP 8/8   Results for orders placed or performed in visit on  11/14/18 (from the past 24 hour(s))  POC Urinalysis Dipstick OB   Collection Time: 11/14/18  2:09 PM  Result Value Ref Range   Color, UA     Clarity, UA     Glucose, UA Negative Negative   Bilirubin, UA     Ketones, UA neg    Spec Grav, UA     Blood, UA neg    pH, UA     POC,PROTEIN,UA Negative Negative, Trace, Small (1+), Moderate (2+), Large (3+), 4+   Urobilinogen, UA     Nitrite, UA neg    Leukocytes, UA Negative Negative   Appearance     Odor      Assessment & Plan:  1) High-risk pregnancy G2P0010 at [redacted]w[redacted]d with an Estimated Date of Delivery: 12/09/18   2) GHTN, stable, home bp's normal, will get pre-e labs today  3) Suspected LGA, EFW 95%/AC 99% today, normal GTT  Meds: No orders of the defined types were placed in this encounter.   Labs/procedures today: bpp/dopp/efw u/s, gbs, gc/ct, sve  Treatment Plan:  2x/wk testing nst alt w/ bpp/dopp    Deliver @ 37-39wks depending on bp's   Reviewed: Preterm labor symptoms and general obstetric precautions including but not limited to vaginal bleeding, contractions, leaking of fluid and fetal movement were reviewed in detail with the patient.  All questions were answered.  Follow-up: Return for Fri as scheduled for hrob/nst, 2/18 needs to be bpp/dopp & hrob (not nst).  Orders Placed This Encounter  Procedures  . GC/Chlamydia Probe Amp  . Culture, beta strep (group b only)  . CBC  . Comprehensive metabolic panel  . Protein /  creatinine ratio, urine  . POC Urinalysis Dipstick OB   Cheral Marker CNM, [facility] 11/14/2018 2:48 PM

## 2018-11-14 NOTE — Patient Instructions (Signed)
Emily Carpenter, I greatly value your feedback.  If you receive a survey following your visit with [place] today, we appreciate you taking the time to fill it out.  Thanks, Joellyn Haff, CNM, WHNP-BC   Call the office [phone]) or go to [facility] if:  You begin to have strong, frequent contractions  Your water breaks.  Sometimes it is a big gush of fluid, sometimes it is just a trickle that keeps getting your panties wet or running down your legs  You have vaginal bleeding.  It is normal to have a small amount of spotting if your cervix was checked.   You don't feel your baby moving like normal.  If you don't, get you something to eat and drink and lay down and focus on feeling your baby move.  You should feel at least 10 movements in 2 hours.  If you don't, you should call the office or go to [facility].    Call the office [phone]) or go to [facility] hospital for these signs of pre-eclampsia:  Severe headache that does not go away with Tylenol  Visual changes- seeing spots, double, blurred vision  Pain under your right breast or upper abdomen that does not go away with Tums or heartburn medicine  Nausea and/or vomiting  Severe swelling in your hands, feet, and face     Preeclampsia and Eclampsia  Preeclampsia is a serious condition that may develop during pregnancy. It is also called toxemia of pregnancy. This condition causes high blood pressure along with other symptoms, such as swelling and headaches. These symptoms may develop as the condition gets worse. Preeclampsia may occur at 20 weeks of pregnancy or later. Diagnosing and treating preeclampsia early is very important. If not treated early, it can cause serious problems for you and your baby. One problem it can lead to is eclampsia. Eclampsia is a condition that causes muscle jerking or shaking (convulsions or seizures) and other serious problems for the mother. During pregnancy, delivering your baby may be the best  treatment for preeclampsia or eclampsia. For most women, preeclampsia and eclampsia symptoms go away after giving birth. In rare cases, a woman may develop preeclampsia after giving birth (postpartum preeclampsia). This usually occurs within 48 hours after childbirth but may occur up to 6 weeks after giving birth. What are the causes? The cause of preeclampsia is not known. What increases the risk? The following risk factors make you more likely to develop preeclampsia:  Being pregnant for the first time.  Having had preeclampsia during a past pregnancy.  Having a family history of preeclampsia.  Having high blood pressure.  Being pregnant with more than one baby.  Being 68 or older.  Being African-American.  Having kidney disease or diabetes.  Having medical conditions such as lupus or blood diseases.  Being very overweight (obese). What are the signs or symptoms? The earliest signs of preeclampsia are:  High blood pressure.  Increased protein in your urine. Your health care provider will check for this at every visit before you give birth (prenatal visit). Other symptoms that may develop as the condition gets worse include:  Severe headaches.  Sudden weight gain.  Swelling of the hands, face, legs, and feet.  Nausea and vomiting.  Vision problems, such as blurred or double vision.  Numbness in the face, arms, legs, and feet.  Urinating less than usual.  Dizziness.  Slurred speech.  Abdominal pain, especially upper abdominal pain.  Convulsions or seizures. How is this diagnosed?  There are no screening tests for preeclampsia. Your health care provider will ask you about symptoms and check for signs of preeclampsia during your prenatal visits. You may also have tests that include:  Urine tests.  Blood tests.  Checking your blood pressure.  Monitoring your baby's heart rate.  Ultrasound. How is this treated? You and your health care provider will  determine the treatment approach that is best for you. Treatment may include:  Having more frequent prenatal exams to check for signs of preeclampsia, if you have an increased risk for preeclampsia.  Medicine to lower your blood pressure.  Staying in the hospital, if your condition is severe. There, treatment will focus on controlling your blood pressure and the amount of fluids in your body (fluid retention).  Taking medicine (magnesium sulfate) to prevent seizures. This may be given as an injection or through an IV.  Taking a low-dose aspirin during your pregnancy.  Delivering your baby early, if your condition gets worse. You may have your labor started with medicine (induced), or you may have a cesarean delivery. Follow these instructions at home: Eating and drinking   Drink enough fluid to keep your urine pale yellow.  Avoid caffeine. Lifestyle  Do not use any products that contain nicotine or tobacco, such as cigarettes and e-cigarettes. If you need help quitting, ask your health care provider.  Do not use alcohol or drugs.  Avoid stress as much as possible. Rest and get plenty of sleep. General instructions  Take over-the-counter and prescription medicines only as told by your health care provider.  When lying down, lie on your left side. This keeps pressure off your major blood vessels.  When sitting or lying down, raise (elevate) your feet. Try putting some pillows underneath your lower legs.  Exercise regularly. Ask your health care provider what kinds of exercise are best for you.  Keep all follow-up and prenatal visits as told by your health care provider. This is important. How is this prevented? There is no known way of preventing preeclampsia or eclampsia from developing. However, to lower your risk of complications and detect problems early:  Get regular prenatal care. Your health care provider may be able to diagnose and treat the condition early.  Maintain  a healthy weight. Ask your health care provider for help managing weight gain during pregnancy.  Work with your health care provider to manage any long-term (chronic) health conditions you have, such as diabetes or kidney problems.  You may have tests of your blood pressure and kidney function after giving birth.  Your health care provider may have you take low-dose aspirin during your next pregnancy. Contact a health care provider if:  You have symptoms that your health care provider told you may require more treatment or monitoring, such as: ? Headaches. ? Nausea or vomiting. ? Abdominal pain. ? Dizziness. ? Light-headedness. Get help right away if:  You have severe: ? Abdominal pain. ? Headaches that do not get better. ? Dizziness. ? Vision problems. ? Confusion. ? Nausea or vomiting.  You have any of the following: ? A seizure. ? Sudden, rapid weight gain. ? Sudden swelling in your hands, ankles, or face. ? Trouble moving any part of your body. ? Numbness in any part of your body. ? Trouble speaking. ? Abnormal bleeding.  You faint. Summary  Preeclampsia is a serious condition that may develop during pregnancy. It is also called toxemia of pregnancy.  This condition causes high blood pressure along with other  symptoms, such as swelling and headaches.  Diagnosing and treating preeclampsia early is very important. If not treated early, it can cause serious problems for you and your baby.  Get help right away if you have symptoms that your health care provider told you to watch for. This information is not intended to replace advice given to you by your health care provider. Make sure you discuss any questions you have with your health care provider. Document Released: 09/17/2000 Document Revised: 09/06/2017 Document Reviewed: 04/26/2016 Elsevier Interactive Patient Education  2019 [employer].

## 2018-11-14 NOTE — Progress Notes (Signed)
[place] 36+3 wks,cephalic,afi 13 cm,posterior placenta gr 3,fhr 146 bpm,RI .58,.52,.60=47%,EFW 3557 g 95%,BPP 8/8

## 2018-11-15 LAB — COMPREHENSIVE METABOLIC PANEL
ALT: 10 IU/L (ref 0–32)
AST: 15 IU/L (ref 0–40)
Albumin/Globulin Ratio: 1.6 (ref 1.2–2.2)
Albumin: 3.3 g/dL — ABNORMAL LOW (ref 3.9–5.0)
Alkaline Phosphatase: 139 IU/L — ABNORMAL HIGH (ref 39–117)
BUN/Creatinine Ratio: 9 (ref 9–23)
BUN: 5 mg/dL — ABNORMAL LOW (ref 6–20)
Bilirubin Total: 0.3 mg/dL (ref 0.0–1.2)
CO2: 17 mmol/L — ABNORMAL LOW (ref 20–29)
Calcium: 8.8 mg/dL (ref 8.7–10.2)
Chloride: 107 mmol/L — ABNORMAL HIGH (ref 96–106)
Creatinine, Ser: 0.57 mg/dL (ref 0.57–1.00)
GFR calc Af Amer: 149 mL/min/{1.73_m2} (ref >59)
GFR calc non Af Amer: 129 mL/min/{1.73_m2} (ref >59)
GLUCOSE: 82 mg/dL (ref 65–99)
Globulin, Total: 2.1 g/dL (ref 1.5–4.5)
Potassium: 3.8 mmol/L (ref 3.5–5.2)
Sodium: 140 mmol/L (ref 134–144)
Total Protein: 5.4 g/dL — ABNORMAL LOW (ref 6.0–8.5)

## 2018-11-15 LAB — CBC
Hematocrit: 30.8 % — ABNORMAL LOW (ref 34.0–46.6)
Hemoglobin: 10.3 g/dL — ABNORMAL LOW (ref 11.1–15.9)
MCH: 28.9 pg (ref 26.6–33.0)
MCHC: 33.4 g/dL (ref 31.5–35.7)
MCV: 86 fL (ref 79–97)
PLATELETS: 232 10*3/uL (ref 150–450)
RBC: 3.57 x10E6/uL — ABNORMAL LOW (ref 3.77–5.28)
RDW: 14.5 % (ref 11.7–15.4)
WBC: 11.2 10*3/uL — ABNORMAL HIGH (ref 3.4–10.8)

## 2018-11-15 LAB — PROTEIN / CREATININE RATIO, URINE
Creatinine, Urine: 71.7 mg/dL
Protein, Ur: 7.2 mg/dL
Protein/Creat Ratio: 100 mg/g creat (ref 0–200)

## 2018-11-16 ENCOUNTER — Encounter: Payer: Self-pay | Admitting: *Deleted

## 2018-11-17 ENCOUNTER — Ambulatory Visit (INDEPENDENT_AMBULATORY_CARE_PROVIDER_SITE_OTHER): Payer: Commercial Managed Care - PPO | Admitting: Obstetrics and Gynecology

## 2018-11-17 ENCOUNTER — Encounter: Payer: Self-pay | Admitting: Obstetrics and Gynecology

## 2018-11-17 ENCOUNTER — Other Ambulatory Visit: Payer: Self-pay

## 2018-11-17 ENCOUNTER — Other Ambulatory Visit: Payer: Commercial Managed Care - PPO

## 2018-11-17 ENCOUNTER — Encounter: Payer: Commercial Managed Care - PPO | Admitting: Obstetrics and Gynecology

## 2018-11-17 ENCOUNTER — Other Ambulatory Visit: Payer: Commercial Managed Care - PPO | Admitting: Obstetrics and Gynecology

## 2018-11-17 VITALS — BP 137/76 | HR 107 | Wt 277.0 lb

## 2018-11-17 DIAGNOSIS — Z331 Pregnant state, incidental: Secondary | ICD-10-CM

## 2018-11-17 DIAGNOSIS — Z3A36 36 weeks gestation of pregnancy: Secondary | ICD-10-CM

## 2018-11-17 DIAGNOSIS — O0990 Supervision of high risk pregnancy, unspecified, unspecified trimester: Secondary | ICD-10-CM

## 2018-11-17 DIAGNOSIS — O133 Gestational [pregnancy-induced] hypertension without significant proteinuria, third trimester: Secondary | ICD-10-CM

## 2018-11-17 DIAGNOSIS — Z1389 Encounter for screening for other disorder: Secondary | ICD-10-CM

## 2018-11-17 LAB — POCT URINALYSIS DIPSTICK OB
Glucose, UA: NEGATIVE
Ketones, UA: NEGATIVE
LEUKOCYTES UA: NEGATIVE
Nitrite, UA: NEGATIVE
POC,PROTEIN,UA: NEGATIVE
RBC UA: NEGATIVE

## 2018-11-17 LAB — GC/CHLAMYDIA PROBE AMP
CHLAMYDIA, DNA PROBE: NEGATIVE
Neisseria gonorrhoeae by PCR: NEGATIVE

## 2018-11-17 NOTE — Progress Notes (Addendum)
Patient ID: Emily Carpenter, female   DOB: 17-Apr-1991, 28 y.o.   MRN: [id number]    [facility] PREGNANCY VISIT Patient name: Emily Carpenter MRN [id number]  Date of birth: [birth year] Chief Complaint:   High Risk Gestation (NST)  History of Present Illness:   Emily Carpenter is a 28 y.o. G71P0010 female at [redacted]w[redacted]d with an Estimated Date of Delivery: 12/09/18 being seen today for ongoing management of a high-risk pregnancy complicated by gestational HTN @ 35 weeks. Has been keeping BP logs at work with mostly normal readings. No blurred vision and headaches. Today she reports no complaints. Contractions: Irregular. Vag. Bleeding: None.  Movement: Present. denies leaking of fluid.  Review of Systems:   Pertinent items are noted in HPI Denies abnormal vaginal discharge w/ itching/odor/irritation, headaches, visual changes, shortness of breath, chest pain, abdominal pain, severe nausea/vomiting, or problems with urination or bowel movements unless otherwise stated above. Pertinent History Reviewed:  Reviewed past medical,surgical, social, obstetrical and family history.  Reviewed problem list, medications and allergies. Physical Assessment:   Vitals:   11/17/18 1143  BP: 137/76  Pulse: (!) 107  Weight: 277 lb (125.6 kg)  Body mass index is 44.04 kg/m.           Physical Examination:   General appearance: alert, well appearing, and in no distress and oriented to person, place, and time  Mental status: alert, oriented to person, place, and time, normal mood, behavior, speech, dress, motor activity, and thought processes, affect appropriate to mood  Skin: warm & dry   Extremities: Edema: Mild pitting, slight indentation    Cardiovascular: normal heart rate noted  Respiratory: normal respiratory effort, no distress  Abdomen: gravid, soft, non-tender  Pelvic: Cervical exam deferred         Fetal Status:     Movement: Present    Fetal Surveillance Testing today: NST   Results for orders placed  or performed in visit on 11/17/18 (from the past 24 hour(s))  POC Urinalysis Dipstick OB   Collection Time: 11/17/18 11:42 AM  Result Value Ref Range   Color, UA     Clarity, UA     Glucose, UA Negative Negative   Bilirubin, UA     Ketones, UA neg    Spec Grav, UA     Blood, UA neg    pH, UA     POC,PROTEIN,UA Negative Negative, Trace, Small (1+), Moderate (2+), Large (3+), 4+   Urobilinogen, UA     Nitrite, UA neg    Leukocytes, UA Negative Negative   Appearance     Odor      Assessment & Plan:  1) High-risk pregnancy G2P0010 at [redacted]w[redacted]d with an Estimated Date of Delivery: 12/09/18   2) GHTN,, improved with blood pressures currently normal   Meds: No orders of the defined types were placed in this encounter.   Labs/procedures today: NST, reactive  Treatment Plan:   1. F/u in 11/21/2018 NST twice weekly until induction 2. IOL  @39  weeks or as required by return of increased pressures Patient will continue to monitor her blood pressures both at work and at office visits here Reviewed: Term labor symptoms and general obstetric precautions including but not limited to vaginal bleeding, contractions, leaking of fluid and fetal movement were reviewed in detail with the patient.  All questions were answered.  Follow-up: No follow-ups on file.  Orders Placed This Encounter  Procedures  . POC Urinalysis Dipstick OB   By signing my name below,  I, Arnette Norris, attest that this documentation has been prepared under the direction and in the presence of Tilda Burrow, MD. Electronically Signed: Arnette Norris Medical Scribe. 11/17/18. 12:07 PM.  I personally performed the services described in this documentation, which was SCRIBED in my presence. The recorded information has been reviewed and considered accurate. It has been edited as necessary during review. Tilda Burrow, MD

## 2018-11-18 LAB — CULTURE, BETA STREP (GROUP B ONLY): Strep Gp B Culture: NEGATIVE

## 2018-11-21 ENCOUNTER — Encounter: Payer: Self-pay | Admitting: Women's Health

## 2018-11-21 ENCOUNTER — Ambulatory Visit (INDEPENDENT_AMBULATORY_CARE_PROVIDER_SITE_OTHER): Payer: Commercial Managed Care - PPO | Admitting: Women's Health

## 2018-11-21 VITALS — BP 128/76 | HR 96 | Wt 278.2 lb

## 2018-11-21 DIAGNOSIS — O133 Gestational [pregnancy-induced] hypertension without significant proteinuria, third trimester: Secondary | ICD-10-CM

## 2018-11-21 DIAGNOSIS — Z3A37 37 weeks gestation of pregnancy: Secondary | ICD-10-CM | POA: Diagnosis not present

## 2018-11-21 DIAGNOSIS — O0993 Supervision of high risk pregnancy, unspecified, third trimester: Secondary | ICD-10-CM | POA: Diagnosis not present

## 2018-11-21 DIAGNOSIS — Z331 Pregnant state, incidental: Secondary | ICD-10-CM

## 2018-11-21 DIAGNOSIS — Z1389 Encounter for screening for other disorder: Secondary | ICD-10-CM

## 2018-11-21 LAB — POCT URINALYSIS DIPSTICK OB
Blood, UA: NEGATIVE
Glucose, UA: NEGATIVE
Ketones, UA: NEGATIVE
Leukocytes, UA: NEGATIVE
Nitrite, UA: NEGATIVE
POC,PROTEIN,UA: NEGATIVE

## 2018-11-21 NOTE — Progress Notes (Signed)
   HIGH-RISK PREGNANCY VISIT Patient name: Emily Carpenter MRN [id number]  Date of birth: May 24, 1991 Chief Complaint:   Routine Prenatal Visit (NST)  History of Present Illness:   TAYLOUR BAIRE is a 28 y.o. G86P0010 female at [redacted]w[redacted]d with an Estimated Date of Delivery: 12/09/18 being seen today for ongoing management of a high-risk pregnancy complicated by [facility] dx @ 35wks, currently improved.  Today she reports all home bp's normal. Denies ha, visual changes, ruq/epigastric pain, n/v.   Contractions: Irregular.  .  Movement: Present. denies leaking of fluid.  Review of Systems:   Pertinent items are noted in HPI Denies abnormal vaginal discharge w/ itching/odor/irritation, headaches, visual changes, shortness of breath, chest pain, abdominal pain, severe nausea/vomiting, or problems with urination or bowel movements unless otherwise stated above. Pertinent History Reviewed:  Reviewed past medical,surgical, social, obstetrical and family history.  Reviewed problem list, medications and allergies. Physical Assessment:   Vitals:   11/21/18 1513  BP: 128/76  Pulse: 96  Weight: 278 lb 3.2 oz (126.2 kg)  Body mass index is 44.23 kg/m.           Physical Examination:   General appearance: alert, well appearing, and in no distress  Mental status: alert, oriented to person, place, and time  Skin: warm & dry   Extremities: Edema: Mild pitting, slight indentation    Cardiovascular: normal heart rate noted  Respiratory: normal respiratory effort, no distress  Abdomen: gravid, soft, non-tender  Pelvic: Cervical exam deferred         Fetal Status: Fetal Heart Rate (bpm): 130 Fundal Height: 38 cm Movement: Present    Fetal Surveillance Testing today: NST: FHR baseline 130 bpm, Variability: moderate, Accelerations:present, Decelerations:  Absent= Cat 1/Reactive Toco: q 2-42mins, not perceived by pt     Results for orders placed or performed in visit on 11/21/18 (from the past 24 hour(s))  POC  Urinalysis Dipstick OB   Collection Time: 11/21/18  3:16 PM  Result Value Ref Range   Color, UA     Clarity, UA     Glucose, UA Negative Negative   Bilirubin, UA     Ketones, UA neg    Spec Grav, UA     Blood, UA neg    pH, UA     POC,PROTEIN,UA Negative Negative, Trace, Small (1+), Moderate (2+), Large (3+), 4+   Urobilinogen, UA     Nitrite, UA neg    Leukocytes, UA Negative Negative   Appearance     Odor      Assessment & Plan:  1) High-risk pregnancy G2P0010 at [redacted]w[redacted]d with an Estimated Date of Delivery: 12/09/18   2) GHTN-improved, stable, bp's normal, asymptomatic, continue checking home bp's. Reviewed pre-e s/s, reasons to seek care  3) Suspected LGA, 95% @ 36wks  Meds: No orders of the defined types were placed in this encounter.  Labs/procedures today: nst  Treatment Plan:  2x/wk testing, IOL 39wks or as indicated  Reviewed: Term labor symptoms and general obstetric precautions including but not limited to vaginal bleeding, contractions, leaking of fluid and fetal movement were reviewed in detail with the patient.  All questions were answered.  Follow-up: Return for As scheduled Friday.  Orders Placed This Encounter  Procedures  . POC Urinalysis Dipstick OB   Cheral Marker CNM, [facility] 11/21/2018 4:05 PM

## 2018-11-21 NOTE — Patient Instructions (Signed)
Emily Carpenter, I greatly value your feedback.  If you receive a survey following your visit with [place] today, we appreciate you taking the time to fill it out.  Thanks, Joellyn Haff, CNM, [facility]  [facility] IS MOVING!!! to [facility] ([address] [place], [place] [address]) on Sunday November 26, 2018 at 5:00am It will be called the [facility], and it is located off of [address]. DO NOT GO TO [address] (the current [facility]) on February 23rd or after, no one will be there!     Call the office [phone]) or go to [facility] if:  You begin to have strong, frequent contractions  Your water breaks.  Sometimes it is a big gush of fluid, sometimes it is just a trickle that keeps getting your panties wet or running down your legs  You have vaginal bleeding.  It is normal to have a small amount of spotting if your cervix was checked.   You don't feel your baby moving like normal.  If you don't, get you something to eat and drink and lay down and focus on feeling your baby move.  You should feel at least 10 movements in 2 hours.  If you don't, you should call the office or go to [facility].   Call the office [phone]) or go to [facility] hospital for these signs of pre-eclampsia:  Severe headache that does not go away with Tylenol  Visual changes- seeing spots, double, blurred vision  Pain under your right breast or upper abdomen that does not go away with Tums or heartburn medicine  Nausea and/or vomiting  Severe swelling in your hands, feet, and face      Braxton Hicks Contractions Contractions of the uterus can occur throughout pregnancy, but they are not always a sign that you are in labor. You may have practice contractions called Braxton Hicks contractions. These false labor contractions are sometimes confused with true labor. What are Deberah Pelton contractions? Braxton Hicks contractions are tightening movements that  occur in the muscles of the uterus before labor. Unlike true labor contractions, these contractions do not result in opening (dilation) and thinning of the cervix. Toward the end of pregnancy (32-34 weeks), Braxton Hicks contractions can happen more often and may become stronger. These contractions are sometimes difficult to tell apart from true labor because they can be very uncomfortable. You should not feel embarrassed if you go to the hospital with false labor. Sometimes, the only way to tell if you are in true labor is for your health care provider to look for changes in the cervix. The health care provider will do a physical exam and may monitor your contractions. If you are not in true labor, the exam should show that your cervix is not dilating and your water has not broken. If there are no other health problems associated with your pregnancy, it is completely safe for you to be sent home with false labor. You may continue to have Braxton Hicks contractions until you go into true labor. How to tell the difference between true labor and false labor True labor  Contractions last 30-70 seconds.  Contractions become very regular.  Discomfort is usually felt in the top of the uterus, and it spreads to the lower abdomen and low back.  Contractions do not go away with walking.  Contractions usually become more intense and increase in frequency.  The cervix dilates and gets thinner. False labor  Contractions are usually shorter and not as strong as true labor contractions.  Contractions are usually irregular.  Contractions are often felt in the front of the lower abdomen and in the groin.  Contractions may go away when you walk around or change positions while lying down.  Contractions get weaker and are shorter-lasting as time goes on.  The cervix usually does not dilate or become thin. Follow these instructions at home:   Take over-the-counter and prescription medicines only as  told by your health care provider.  Keep up with your usual exercises and follow other instructions from your health care provider.  Eat and drink lightly if you think you are going into labor.  If Braxton Hicks contractions are making you uncomfortable: ? Change your position from lying down or resting to walking, or change from walking to resting. ? Sit and rest in a tub of warm water. ? Drink enough fluid to keep your urine pale yellow. Dehydration may cause these contractions. ? Do slow and deep breathing several times an hour.  Keep all follow-up prenatal visits as told by your health care provider. This is important. Contact a health care provider if:  You have a fever.  You have continuous pain in your abdomen. Get help right away if:  Your contractions become stronger, more regular, and closer together.  You have fluid leaking or gushing from your vagina.  You pass blood-tinged mucus (bloody show).  You have bleeding from your vagina.  You have low back pain that you never had before.  You feel your baby's head pushing down and causing pelvic pressure.  Your baby is not moving inside you as much as it used to. Summary  Contractions that occur before labor are called Braxton Hicks contractions, false labor, or practice contractions.  Braxton Hicks contractions are usually shorter, weaker, farther apart, and less regular than true labor contractions. True labor contractions usually become progressively stronger and regular, and they become more frequent.  Manage discomfort from [facility] contractions by changing position, resting in a warm bath, drinking plenty of water, or practicing deep breathing. This information is not intended to replace advice given to you by your health care provider. Make sure you discuss any questions you have with your health care provider. Document Released: 02/03/2017 Document Revised: 07/05/2017 Document Reviewed: 02/03/2017 Elsevier  Interactive Patient Education  2019 [employer].

## 2018-11-24 ENCOUNTER — Ambulatory Visit (INDEPENDENT_AMBULATORY_CARE_PROVIDER_SITE_OTHER): Payer: Commercial Managed Care - PPO | Admitting: *Deleted

## 2018-11-24 VITALS — BP 138/76 | HR 105 | Wt 278.6 lb

## 2018-11-24 DIAGNOSIS — Z331 Pregnant state, incidental: Secondary | ICD-10-CM

## 2018-11-24 DIAGNOSIS — O133 Gestational [pregnancy-induced] hypertension without significant proteinuria, third trimester: Secondary | ICD-10-CM | POA: Diagnosis not present

## 2018-11-24 DIAGNOSIS — O0993 Supervision of high risk pregnancy, unspecified, third trimester: Secondary | ICD-10-CM

## 2018-11-24 DIAGNOSIS — Z1389 Encounter for screening for other disorder: Secondary | ICD-10-CM

## 2018-11-24 LAB — POCT URINALYSIS DIPSTICK OB
Blood, UA: NEGATIVE
Glucose, UA: NEGATIVE
Ketones, UA: NEGATIVE
Leukocytes, UA: NEGATIVE
Nitrite, UA: NEGATIVE
POC,PROTEIN,UA: NEGATIVE

## 2018-11-28 ENCOUNTER — Encounter: Payer: Self-pay | Admitting: Obstetrics & Gynecology

## 2018-11-28 ENCOUNTER — Ambulatory Visit (INDEPENDENT_AMBULATORY_CARE_PROVIDER_SITE_OTHER): Payer: Commercial Managed Care - PPO | Admitting: Obstetrics & Gynecology

## 2018-11-28 ENCOUNTER — Ambulatory Visit (INDEPENDENT_AMBULATORY_CARE_PROVIDER_SITE_OTHER): Payer: Commercial Managed Care - PPO

## 2018-11-28 VITALS — BP 132/81 | HR 105 | Wt 281.0 lb

## 2018-11-28 DIAGNOSIS — Z331 Pregnant state, incidental: Secondary | ICD-10-CM

## 2018-11-28 DIAGNOSIS — O133 Gestational [pregnancy-induced] hypertension without significant proteinuria, third trimester: Secondary | ICD-10-CM

## 2018-11-28 DIAGNOSIS — O0993 Supervision of high risk pregnancy, unspecified, third trimester: Secondary | ICD-10-CM

## 2018-11-28 DIAGNOSIS — Z1389 Encounter for screening for other disorder: Secondary | ICD-10-CM

## 2018-11-28 DIAGNOSIS — Z3A38 38 weeks gestation of pregnancy: Secondary | ICD-10-CM

## 2018-11-28 LAB — POCT URINALYSIS DIPSTICK OB
Blood, UA: NEGATIVE
Glucose, UA: NEGATIVE
KETONES UA: NEGATIVE
Leukocytes, UA: NEGATIVE
Nitrite, UA: NEGATIVE
POC,PROTEIN,UA: NEGATIVE

## 2018-11-28 NOTE — Progress Notes (Signed)
   HIGH-RISK PREGNANCY VISIT Patient name: Emily Carpenter MRN [id number]  Date of birth: 11-15-1990 Chief Complaint:   Routine Prenatal Visit (BPP)  History of Present Illness:   Emily Carpenter is a 28 y.o. G66P0010 female at [redacted]w[redacted]d with an Estimated Date of Delivery: 12/09/18 being seen today for ongoing management of a high-risk pregnancy complicated by gestational HTN. Which has seemingly resolved spontaneously Today she reports no complaints. Contractions: Irregular. Vag. Bleeding: None.  Movement: Present. denies leaking of fluid.  Review of Systems:   Pertinent items are noted in HPI Denies abnormal vaginal discharge w/ itching/odor/irritation, headaches, visual changes, shortness of breath, chest pain, abdominal pain, severe nausea/vomiting, or problems with urination or bowel movements unless otherwise stated above. Pertinent History Reviewed:  Reviewed past medical,surgical, social, obstetrical and family history.  Reviewed problem list, medications and allergies. Physical Assessment:   Vitals:   11/28/18 1456  BP: 132/81  Pulse: (!) 105  Weight: 281 lb (127.5 kg)  Body mass index is 44.68 kg/m.           Physical Examination:   General appearance: alert, well appearing, and in no distress  Mental status: alert, oriented to person, place, and time  Skin: warm & dry   Extremities: Edema: Mild pitting, slight indentation    Cardiovascular: normal heart rate noted  Respiratory: normal respiratory effort, no distress  Abdomen: gravid, soft, non-tender  Pelvic: Cervical exam performed  Dilation: 2 Effacement (%): Thick Station: -3  Fetal Status: Fetal Heart Rate (bpm): 132 Fundal Height: 39 cm Movement: Present    Fetal Surveillance Testing today: BPP 8/8 with normal Dopplers   Results for orders placed or performed in visit on 11/28/18 (from the past 24 hour(s))  POC Urinalysis Dipstick OB   Collection Time: 11/28/18  3:01 PM  Result Value Ref Range   Color, UA     Clarity, UA     Glucose, UA Negative Negative   Bilirubin, UA     Ketones, UA neg    Spec Grav, UA     Blood, UA neg    pH, UA     POC,PROTEIN,UA Negative Negative, Trace, Small (1+), Moderate (2+), Large (3+), 4+   Urobilinogen, UA     Nitrite, UA neg    Leukocytes, UA Negative Negative   Appearance     Odor      Assessment & Plan:  1) High-risk pregnancy G2P0010 at [redacted]w[redacted]d with an Estimated Date of Delivery: 12/09/18   2) GHTN, stable, has really pulled back so absolute management at 39 weeks is no mandatory, discussed with pt/husband and will extend out to 40 weeks unless clinical status dictates otherwise  3) LGA/suspected fetal macrosomia, EFW 4300 grams  Meds: No orders of the defined types were placed in this encounter.   Labs/procedures today: BPP 8/8  Treatment Plan:  As above, NST in 3 days  Reviewed:  labor symptoms and general obstetric precautions including but not limited to vaginal bleeding, contractions, leaking of fluid and fetal movement were reviewed in detail with the patient.  All questions were answered.  Follow-up: Return in about 3 days (around 12/01/2018) for NST, HROB, with Dr Despina Hidden.  Orders Placed This Encounter  Procedures  . POC Urinalysis Dipstick OB   Amaryllis Dyke Eure  11/28/2018 3:30 PM

## 2018-11-28 NOTE — Progress Notes (Signed)
[place] 38+3 wks,cephalic,posterior placenta gr 3,afi 17 cm,fhr 132 bpm,BPP 8/8,EFW 4288 g 99%,RI .49,.61,.55=51%

## 2018-12-01 ENCOUNTER — Telehealth (HOSPITAL_COMMUNITY): Payer: Self-pay | Admitting: *Deleted

## 2018-12-01 ENCOUNTER — Other Ambulatory Visit: Payer: Self-pay

## 2018-12-01 ENCOUNTER — Encounter: Payer: Self-pay | Admitting: Obstetrics & Gynecology

## 2018-12-01 ENCOUNTER — Ambulatory Visit (INDEPENDENT_AMBULATORY_CARE_PROVIDER_SITE_OTHER): Payer: Commercial Managed Care - PPO | Admitting: Obstetrics & Gynecology

## 2018-12-01 VITALS — BP 138/76 | HR 106 | Wt 280.0 lb

## 2018-12-01 DIAGNOSIS — O0993 Supervision of high risk pregnancy, unspecified, third trimester: Secondary | ICD-10-CM

## 2018-12-01 DIAGNOSIS — O133 Gestational [pregnancy-induced] hypertension without significant proteinuria, third trimester: Secondary | ICD-10-CM

## 2018-12-01 DIAGNOSIS — Z3A38 38 weeks gestation of pregnancy: Secondary | ICD-10-CM

## 2018-12-01 DIAGNOSIS — Z331 Pregnant state, incidental: Secondary | ICD-10-CM

## 2018-12-01 DIAGNOSIS — Z1389 Encounter for screening for other disorder: Secondary | ICD-10-CM

## 2018-12-01 DIAGNOSIS — O3660X0 Maternal care for excessive fetal growth, unspecified trimester, not applicable or unspecified: Secondary | ICD-10-CM

## 2018-12-01 LAB — POCT URINALYSIS DIPSTICK OB
Blood, UA: NEGATIVE
Glucose, UA: NEGATIVE
Ketones, UA: NEGATIVE
LEUKOCYTES UA: NEGATIVE
Nitrite, UA: NEGATIVE
POC,PROTEIN,UA: NEGATIVE

## 2018-12-01 LAB — OB RESULTS CONSOLE GBS: GBS: NEGATIVE

## 2018-12-01 NOTE — Telephone Encounter (Signed)
Preadmission screen  

## 2018-12-01 NOTE — Progress Notes (Signed)
   HIGH-RISK PREGNANCY VISIT Patient name: Emily Carpenter MRN [id number]  Date of birth: 1991/09/27 Chief Complaint:   High Risk Gestation (NST)  History of Present Illness:   Emily Carpenter is a 28 y.o. G55P0010 female at [redacted]w[redacted]d with an Estimated Date of Delivery: 12/09/18 being seen today for ongoing management of a high-risk pregnancy complicated by gestational HTN, LGA.  Today she reports no complaints. Contractions: Irregular. Vag. Bleeding: None.  Movement: Present. denies leaking of fluid.  Review of Systems:   Pertinent items are noted in HPI Denies abnormal vaginal discharge w/ itching/odor/irritation, headaches, visual changes, shortness of breath, chest pain, abdominal pain, severe nausea/vomiting, or problems with urination or bowel movements unless otherwise stated above. Pertinent History Reviewed:  Reviewed past medical,surgical, social, obstetrical and family history.  Reviewed problem list, medications and allergies. Physical Assessment:   Vitals:   12/01/18 1042  BP: 138/76  Pulse: (!) 106  Weight: 280 lb (127 kg)  Body mass index is 44.52 kg/m.           Physical Examination:   General appearance: alert, well appearing, and in no distress  Mental status: alert, oriented to person, place, and time  Skin: warm & dry   Extremities: Edema: Mild pitting, slight indentation    Cardiovascular: normal heart rate noted  Respiratory: normal respiratory effort, no distress  Abdomen: gravid, soft, non-tender  Pelvic: Cervical exam deferred         Fetal Status:     Movement: Present    Fetal Surveillance Testing today: reactive NST   Results for orders placed or performed in visit on 12/01/18 (from the past 24 hour(s))  POC Urinalysis Dipstick OB   Collection Time: 12/01/18 10:47 AM  Result Value Ref Range   Color, UA     Clarity, UA     Glucose, UA Negative Negative   Bilirubin, UA     Ketones, UA neg    Spec Grav, UA     Blood, UA neg    pH, UA     POC,PROTEIN,UA Negative Negative, Trace, Small (1+), Moderate (2+), Large (3+), 4+   Urobilinogen, UA     Nitrite, UA neg    Leukocytes, UA Negative Negative   Appearance     Odor      Assessment & Plan:  1) High-risk pregnancy G2P0010 at [redacted]w[redacted]d with an Estimated Date of Delivery: 12/09/18   2) Borderline GHTN, stable, will proceed with IOL at 40 weeks, fetal surveillance next week Friday 3/6 @2345 , will place foley bulb if otherwise reasonable to do  3) LGA, suspected macrosomia, EFW 4300 grams,   Meds: No orders of the defined types were placed in this encounter.   Labs/procedures today: reactive NST  Treatment Plan:  IOL 3/7 fetal surveillance next week  Reviewed: Term labor symptoms and general obstetric precautions including but not limited to vaginal bleeding, contractions, leaking of fluid and fetal movement were reviewed in detail with the patient.  All questions were answered.  Follow-up: Return in about 4 days (around 12/05/2018) for BPP/sono, HROB.  Orders Placed This Encounter  Procedures  . POC Urinalysis Dipstick OB   Amaryllis Dyke Giancarlos Berendt  12/01/2018 11:15 AM

## 2018-12-01 NOTE — Addendum Note (Signed)
Addended by: Lazaro Arms on: 12/01/2018 12:37 PM   Modules accepted: Orders, SmartSet

## 2018-12-01 NOTE — Treatment Plan (Signed)
   Induction Assessment Scheduling Form: [facility]ax to [facility]:  [phone]  Emily Carpenter                                                                                   DOB:  07-11-1991                                                            MRN:  [id number]                                                                     Phone #:     [phone]                       Provider:  [facility]  GP:  G2P0010                                                            Estimated Date of Delivery: 12/09/18  Dating Criteria: LMP + early sonogram    Medical Indications for induction:  cytotec/foley Admission Date/Time:  12/08/2018@2345  Gestational age on admission:  [redacted]w[redacted]d   Filed Weights   12/01/18 1042  Weight: 280 lb (127 kg)   HIV:  Non Reactive (12/11 9753) GBSnegative:    Cervix not examined at time of scheduling   Method of induction(proposed):  choice   Scheduling Provider Signature:  Lazaro Arms, MD                                            Today's Date:  12/01/2018

## 2018-12-04 ENCOUNTER — Other Ambulatory Visit: Payer: Self-pay | Admitting: Advanced Practice Midwife

## 2018-12-04 ENCOUNTER — Other Ambulatory Visit: Payer: Self-pay | Admitting: Family Medicine

## 2018-12-04 DIAGNOSIS — O133 Gestational [pregnancy-induced] hypertension without significant proteinuria, third trimester: Secondary | ICD-10-CM

## 2018-12-05 ENCOUNTER — Ambulatory Visit (INDEPENDENT_AMBULATORY_CARE_PROVIDER_SITE_OTHER): Payer: Commercial Managed Care - PPO | Admitting: Women's Health

## 2018-12-05 ENCOUNTER — Ambulatory Visit (INDEPENDENT_AMBULATORY_CARE_PROVIDER_SITE_OTHER): Payer: Commercial Managed Care - PPO

## 2018-12-05 ENCOUNTER — Encounter: Payer: Self-pay | Admitting: Women's Health

## 2018-12-05 VITALS — BP 124/71 | HR 108 | Wt 277.3 lb

## 2018-12-05 DIAGNOSIS — Z3A39 39 weeks gestation of pregnancy: Secondary | ICD-10-CM

## 2018-12-05 DIAGNOSIS — O133 Gestational [pregnancy-induced] hypertension without significant proteinuria, third trimester: Secondary | ICD-10-CM

## 2018-12-05 DIAGNOSIS — Z331 Pregnant state, incidental: Secondary | ICD-10-CM

## 2018-12-05 DIAGNOSIS — O0993 Supervision of high risk pregnancy, unspecified, third trimester: Secondary | ICD-10-CM

## 2018-12-05 DIAGNOSIS — Z1389 Encounter for screening for other disorder: Secondary | ICD-10-CM

## 2018-12-05 LAB — POCT URINALYSIS DIPSTICK OB
Blood, UA: NEGATIVE
Glucose, UA: NEGATIVE
Ketones, UA: NEGATIVE
Leukocytes, UA: NEGATIVE
Nitrite, UA: NEGATIVE
POC,PROTEIN,UA: NEGATIVE

## 2018-12-05 NOTE — Progress Notes (Signed)
[place] 39+3 wks,cephalic,BPP 8/8,posterior placenta gr 3,normal ovaries bilat,RI .57,.64,.59,.55=74%,FHR 140 bpm,AFI 16 cm

## 2018-12-05 NOTE — Progress Notes (Signed)
   HIGH-RISK PREGNANCY VISIT Patient name: Emily Carpenter MRN [id number]  Date of birth: 1991/05/03 Chief Complaint:   High Risk Gestation (u/s)  History of Present Illness:   Emily Carpenter is a 28 y.o. G65P0010 female at [redacted]w[redacted]d with an Estimated Date of Delivery: 12/09/18 being seen today for ongoing management of a high-risk pregnancy complicated by gestational HTN dx @ 35wks, since resolved.  Today she reports continued normal bp's at home. Denies ha, visual changes, ruq/epigastric pain, n/v.   Contractions: Irregular.  .  Movement: Present. denies leaking of fluid.  Review of Systems:   Pertinent items are noted in HPI Denies abnormal vaginal discharge w/ itching/odor/irritation, headaches, visual changes, shortness of breath, chest pain, abdominal pain, severe nausea/vomiting, or problems with urination or bowel movements unless otherwise stated above. Pertinent History Reviewed:  Reviewed past medical,surgical, social, obstetrical and family history.  Reviewed problem list, medications and allergies. Physical Assessment:   Vitals:   12/05/18 1049  BP: 124/71  Pulse: (!) 108  Weight: 277 lb 4.8 oz (125.8 kg)  Body mass index is 44.09 kg/m.           Physical Examination:   General appearance: alert, well appearing, and in no distress  Mental status: alert, oriented to person, place, and time  Skin: warm & dry   Extremities: Edema: Mild pitting, slight indentation    Cardiovascular: normal heart rate noted  Respiratory: normal respiratory effort, no distress  Abdomen: gravid, soft, non-tender  Pelvic: Cervical exam performed  Dilation: 1.5 Effacement (%): Thick Station: -3  Fetal Status: Fetal Heart Rate (bpm): 140 u/s Fundal Height: 40 cm Movement: Present Presentation: Vertex  Fetal Surveillance Testing today: [place] 39+3 wks,cephalic,BPP 8/8,posterior placenta gr 3,normal ovaries bilat,RI .57,.64,.59,.55=74%,FHR 140 bpm,AFI 16 cm  Results for orders placed or performed in visit  on 12/05/18 (from the past 24 hour(s))  POC Urinalysis Dipstick OB   Collection Time: 12/05/18 10:52 AM  Result Value Ref Range   Color, UA     Clarity, UA     Glucose, UA Negative Negative   Bilirubin, UA     Ketones, UA neg    Spec Grav, UA     Blood, UA neg    pH, UA     POC,PROTEIN,UA Negative Negative, Trace, Small (1+), Moderate (2+), Large (3+), 4+   Urobilinogen, UA     Nitrite, UA neg    Leukocytes, UA Negative Negative   Appearance     Odor      Assessment & Plan:  1) High-risk pregnancy G2P0010 at [redacted]w[redacted]d with an Estimated Date of Delivery: 12/09/18   2) GHTN, resolved  3) Suspected LGA, 99%/4288g @ 36.3wks  Meds: No orders of the defined types were placed in this encounter.   Labs/procedures today: sve  Treatment Plan:  IOL already scheduled for 3/7 @ [place], plan foley on Friday visit  Reviewed: Term labor symptoms and general obstetric precautions including but not limited to vaginal bleeding, contractions, leaking of fluid and fetal movement were reviewed in detail with the patient.  All questions were answered.  Follow-up: Return for as scheduled for hrob/nst on Friday; cancel appts after 3/6.  Orders Placed This Encounter  Procedures  . POC Urinalysis Dipstick OB   Cheral Marker CNM, [facility] 12/05/2018 11:14 AM

## 2018-12-05 NOTE — Patient Instructions (Signed)
Ester Rink, I greatly value your feedback.  If you receive a survey following your visit with [place] today, we appreciate you taking the time to fill it out.  Thanks, Joellyn Haff, CNM, WHNP-BC   Call the office [phone]) or go to [facility] if:  You begin to have strong, frequent contractions  Your water breaks.  Sometimes it is a big gush of fluid, sometimes it is just a trickle that keeps getting your panties wet or running down your legs  You have vaginal bleeding.  It is normal to have a small amount of spotting if your cervix was checked.   You don't feel your baby moving like normal.  If you don't, get you something to eat and drink and lay down and focus on feeling your baby move.  You should feel at least 10 movements in 2 hours.  If you don't, you should call the office or go to [facility].

## 2018-12-08 ENCOUNTER — Other Ambulatory Visit: Payer: Self-pay

## 2018-12-08 ENCOUNTER — Encounter: Payer: Self-pay | Admitting: Women's Health

## 2018-12-08 ENCOUNTER — Inpatient Hospital Stay (HOSPITAL_COMMUNITY)
Admission: AD | Admit: 2018-12-08 | Discharge: 2018-12-11 | DRG: 807 | Disposition: A | Discharge status: Home / Self Care | Payer: Commercial Managed Care - PPO | Attending: Family Medicine | Admitting: Family Medicine

## 2018-12-08 ENCOUNTER — Ambulatory Visit (INDEPENDENT_AMBULATORY_CARE_PROVIDER_SITE_OTHER): Payer: Commercial Managed Care - PPO | Admitting: Women's Health

## 2018-12-08 VITALS — BP 149/79 | HR 107 | Wt 277.0 lb

## 2018-12-08 DIAGNOSIS — Z331 Pregnant state, incidental: Secondary | ICD-10-CM

## 2018-12-08 DIAGNOSIS — Z3A39 39 weeks gestation of pregnancy: Secondary | ICD-10-CM | POA: Diagnosis not present

## 2018-12-08 DIAGNOSIS — O134 Gestational [pregnancy-induced] hypertension without significant proteinuria, complicating childbirth: Secondary | ICD-10-CM | POA: Diagnosis present

## 2018-12-08 DIAGNOSIS — O0993 Supervision of high risk pregnancy, unspecified, third trimester: Secondary | ICD-10-CM | POA: Diagnosis not present

## 2018-12-08 DIAGNOSIS — O99214 Obesity complicating childbirth: Secondary | ICD-10-CM | POA: Diagnosis present

## 2018-12-08 DIAGNOSIS — Z3A40 40 weeks gestation of pregnancy: Secondary | ICD-10-CM | POA: Diagnosis not present

## 2018-12-08 DIAGNOSIS — O480 Post-term pregnancy: Secondary | ICD-10-CM | POA: Diagnosis not present

## 2018-12-08 DIAGNOSIS — O133 Gestational [pregnancy-induced] hypertension without significant proteinuria, third trimester: Secondary | ICD-10-CM | POA: Diagnosis not present

## 2018-12-08 DIAGNOSIS — O3663X0 Maternal care for excessive fetal growth, third trimester, not applicable or unspecified: Secondary | ICD-10-CM | POA: Diagnosis present

## 2018-12-08 DIAGNOSIS — Z1389 Encounter for screening for other disorder: Secondary | ICD-10-CM

## 2018-12-08 DIAGNOSIS — O139 Gestational [pregnancy-induced] hypertension without significant proteinuria, unspecified trimester: Secondary | ICD-10-CM | POA: Diagnosis present

## 2018-12-08 LAB — POCT URINALYSIS DIPSTICK OB
Blood, UA: NEGATIVE
Glucose, UA: NEGATIVE
Ketones, UA: NEGATIVE
Leukocytes, UA: NEGATIVE
Nitrite, UA: NEGATIVE
POC,PROTEIN,UA: NEGATIVE

## 2018-12-08 NOTE — H&P (Addendum)
LABOR AND DELIVERY ADMISSION HISTORY AND PHYSICAL NOTE  Emily Carpenter is a 28 y.o. female G2P0010 with IUP at [redacted]w[redacted]d by LMP presenting for IOL for gHTN and LGA.  She reports positive fetal movement. She denies leakage of fluid or vaginal bleeding.  Prenatal History/Complications: PNC at [facility]  Pregnancy complications:  - gestational HTN - LGA: EFW 4288g, 99% on 11/28/18  Past Medical History: Past Medical History:  Diagnosis Date  . Contraceptive management 06/25/2013  . Medical history non-contributory     Past Surgical History: Past Surgical History:  Procedure Laterality Date  . TONSILLECTOMY AND ADENOIDECTOMY      Obstetrical History: OB History    Gravida  2   Para  0   Term      Preterm      AB  1   Living  0     SAB  1   TAB      Ectopic      Multiple      Live Births              Social History: Social History   Socioeconomic History  . Marital status: Married    Spouse name: Not on file  . Number of children: Not on file  . Years of education: Not on file  . Highest education level: Not on file  Occupational History  . Not on file  Social Needs  . Financial resource strain: Not on file  . Food insecurity:    Worry: Not on file    Inability: Not on file  . Transportation needs:    Medical: Not on file    Non-medical: Not on file  Tobacco Use  . Smoking status: Never Smoker  . Smokeless tobacco: Never Used  Substance and Sexual Activity  . Alcohol use: Not Currently    Alcohol/week: 1.0 standard drinks    Types: 1 Cans of beer per week    Comment: occassionally/ stop  . Drug use: No  . Sexual activity: Yes    Birth control/protection: None  Lifestyle  . Physical activity:    Days per week: Not on file    Minutes per session: Not on file  . Stress: Not on file  Relationships  . Social connections:    Talks on phone: Not on file    Gets together: Not on file    Attends religious service: Not on file    Active  member of club or organization: Not on file    Attends meetings of clubs or organizations: Not on file    Relationship status: Not on file  Other Topics Concern  . Not on file  Social History Narrative  . Not on file    Family History: Family History  Problem Relation Age of Onset  . Hypertension Paternal Grandmother   . Diabetes Maternal Grandfather   . Hypertension Maternal Grandfather   . Hypertension Father   . Heart disease Paternal Grandfather        atrial fib  . Cancer Paternal Grandfather        liver  . Cancer Mother        precancerous cells on cervix  . Other Mother        hx of blood clots due to birth control  . Cancer Maternal Grandmother        cervical cancer  . Other Maternal Grandmother        copd  . Cancer Other  great grandmother, cervical  . Cancer Other        great grandmother breast  . Cancer Other        greatmother-alzheimers    Allergies: No Known Allergies  Medications Prior to Admission  Medication Sig Dispense Refill Last Dose  . Calcium Carbonate Antacid (TUMS PO) Take by mouth as needed.   Taking  . ferrous sulfate 325 (65 FE) MG tablet Take 325 mg by mouth daily with breakfast.   Taking  . Prenat-FeCbn-FeAspGl-FA-Omega (OB COMPLETE PETITE) 35-5-1-200 MG CAPS Take daily 30 capsule 12 Taking     Review of Systems  All systems reviewed and negative except as stated in HPI  Physical Exam Blood pressure (!) 148/75, pulse 94, temperature 98.6 F (37 C), temperature source Oral, height  (1.676 m), weight 125.6 kg, last menstrual period 03/04/2018. General appearance: alert, oriented, NAD Lungs: normal respiratory effort Heart: regular rate Abdomen: soft, non-tender; gravid, FH appropriate for GA Extremities: No calf swelling or tenderness Presentation: cephalic Fetal monitoring: baseline 145 bpm, moderate variability, + acels, no decels Uterine activity: ctx q2-3 mins Dilation: 4 Effacement (%): 50 Station: -3 Exam  by:: Mauri Reading, MD  Prenatal labs: ABO, Rh: --/--/O POS, O POS Performed at [facility], [address] [place], [place] [address]  [phone]/07 0050) Antibody: NEG (03/07 0050) Rubella: 3.36 (08/07 1047) RPR: Non Reactive (12/11 1914)  HBsAg: Negative (08/07 1047)  HIV: Non Reactive (12/11 0838)  GC/Chlamydia: Negative GBS:   GBS negative 2-hr GTT: Normal (80/146/128) Genetic screening:  Negative Anatomy US: Bilateral CPC that resolved on repeat U/S at 28 weeks, otherwise normal  Prenatal Transfer Tool  Maternal Diabetes: No Genetic Screening: Normal Maternal Ultrasounds/Referrals: Normal Fetal Ultrasounds or other Referrals:  None Maternal Substance Abuse:  No Significant Maternal Medications:  None Significant Maternal Lab Results: None  Results for orders placed or performed during the hospital encounter of 12/08/18 (from the past 24 hour(s))  CBC   Collection Time: 12/09/18 12:41 AM  Result Value Ref Range   WBC 14.9 (H) 4.0 - 10.5 K/uL   RBC 4.04 3.87 - 5.11 MIL/uL   Hemoglobin 11.7 (L) 12.0 - 15.0 g/dL   HCT 78.2 95.6 - 21.3 %   MCV 91.8 80.0 - 100.0 fL   MCH 29.0 26.0 - 34.0 pg   MCHC 31.5 30.0 - 36.0 g/dL   RDW 08.6 (H) 57.8 - 46.9 %   Platelets 200 150 - 400 K/uL   nRBC 0.0 0.0 - 0.2 %  Type and screen   Collection Time: 12/09/18 12:50 AM  Result Value Ref Range   ABO/RH(D) O POS    Antibody Screen NEG    Sample Expiration      12/12/2018 Performed at [facility], [address], [place], [place] [address]   ABO/Rh   Collection Time: 12/09/18 12:50 AM  Result Value Ref Range   ABO/RH(D)      O POS Performed at [facility], [address] [place], [place] [address]   Results for orders placed or performed in visit on 12/08/18 (from the past 24 hour(s))  POC Urinalysis Dipstick OB   Collection Time: 12/08/18 10:11 AM  Result Value Ref Range   Color, UA     Clarity, UA     Glucose, UA Negative Negative   Bilirubin, UA     Ketones,  UA neg    Spec Grav, UA     Blood, UA neg  pH, UA     POC,PROTEIN,UA Negative Negative, Trace, Small (1+), Moderate (2+), Large (3+), 4+   Urobilinogen, UA     Nitrite, UA neg    Leukocytes, UA Negative Negative   Appearance     Odor      Patient Active Problem List   Diagnosis Date Noted  . Gestational hypertension 12/09/2018  . Gestational hypertension, third trimester 11/08/2018  . Supervision of high risk pregnancy, antepartum 05/10/2018    Assessment: Emily Carpenter is a 28 y.o. G2P0010 at [redacted]w[redacted]d here for IOL for gHTN and LGA.  #Labor: S/p FB placement in office today. Has progressed well. Will start low dose Pitocin 2x2 #Pain: Desires Epidural. IV Pain meds/Epidural upon request #FWB: Cat I #ID:  GBS negative, GC/Ch negative  #gHTN: BP 149/79 on admission. Currently asymptomatic. Will start Mag if develops severe features. IV Hydral/Labetolol if >160/>110.  #Postnatal Planning: - [x] Flu/[x] TDAP - Girl/Breast/Paraguard  Kiersten P Mullis 12/09/2018, 2:03 AM   OB FELLOW HISTORY AND PHYSICAL ATTESTATION  I have seen and examined this patient; I agree with above documentation in the resident's note.   Gwenevere Abbot, MD  OB Fellow  12/09/2018, 2:42 AM

## 2018-12-08 NOTE — Progress Notes (Signed)
HIGH-RISK PREGNANCY VISIT Patient name: Emily Carpenter MRN [id number]  Date of birth: [birth year] Chief Complaint:   High Risk Gestation (NST)  History of Present Illness:   Emily Carpenter is a 28 y.o. G51P0010 female at [redacted]w[redacted]d with an Estimated Date of Delivery: 12/09/18 being seen today for ongoing management of a high-risk pregnancy complicated by [facility] dx @ 35wks, since improved Today she reports no complaints. BP at home this am 120s/70s. Just anxious about foley bulb. Contractions: Irregular. Vag. Bleeding: None.  Movement: Present. denies leaking of fluid.  Review of Systems:   Pertinent items are noted in HPI Denies abnormal vaginal discharge w/ itching/odor/irritation, headaches, visual changes, shortness of breath, chest pain, abdominal pain, severe nausea/vomiting, or problems with urination or bowel movements unless otherwise stated above. Pertinent History Reviewed:  Reviewed past medical,surgical, social, obstetrical and family history.  Reviewed problem list, medications and allergies. Physical Assessment:   Vitals:   12/08/18 1011 12/08/18 1113  BP: (!) 153/78 (!) 149/79  Pulse: (!) 111 (!) 107  Weight: 277 lb (125.6 kg)   Body mass index is 44.04 kg/m.           Physical Examination:   General appearance: alert, well appearing, and in no distress  Mental status: alert, oriented to person, place, and time  Skin: warm & dry   Extremities: Edema: Mild pitting, slight indentation    Cardiovascular: normal heart rate noted  Respiratory: normal respiratory effort, no distress  Abdomen: gravid, soft, non-tender  Pelvic: Cervical exam performed  Dilation: 1.5 Effacement (%): Thick Station: -3  Fetal Status: Fetal Heart Rate (bpm): 145 Fundal Height: 40 cm Movement: Present Presentation: Vertex  Fetal Surveillance Testing today: NST: FHR baseline 145 bpm, Variability: moderate, Accelerations:present, Decelerations:  Absent= Cat 1/Reactive Toco: regular, every 2-3  minutes-not perceived by pt    Cervical foley bulb inserted and inflated w/ 55ml LR w/o difficulty   Results for orders placed or performed in visit on 12/08/18 (from the past 24 hour(s))  POC Urinalysis Dipstick OB   Collection Time: 12/08/18 10:11 AM  Result Value Ref Range   Color, UA     Clarity, UA     Glucose, UA Negative Negative   Bilirubin, UA     Ketones, UA neg    Spec Grav, UA     Blood, UA neg    pH, UA     POC,PROTEIN,UA Negative Negative, Trace, Small (1+), Moderate (2+), Large (3+), 4+   Urobilinogen, UA     Nitrite, UA neg    Leukocytes, UA Negative Negative   Appearance     Odor      Assessment & Plan:  1) High-risk pregnancy G2P0010 at [redacted]w[redacted]d with an Estimated Date of Delivery: 12/09/18   2) GHTN, stable, IOL tonight at [facility] as scheduled, foley bulb placed today in office, discussed reasons to go to [facility] prior to [facility]  3) Suspected LGA, 95%/4288g @ 36.3wks  Meds: No orders of the defined types were placed in this encounter.   Labs/procedures today: nst, foley bulb  Treatment Plan:  IOL as scheduled  Reviewed: Term labor symptoms and general obstetric precautions including but not limited to vaginal bleeding, contractions, leaking of fluid and fetal movement were reviewed in detail with the patient.  All questions were answered.  Follow-up: Return in about 1 week (around 12/15/2018) for bp check; then 6wks pp visit.  Orders Placed This Encounter  Procedures  . POC Urinalysis Dipstick OB   Merlene Laughter  Cedric Fishman, WHNP-BC 12/08/2018 11:16 AM

## 2018-12-08 NOTE — Patient Instructions (Signed)

## 2018-12-09 ENCOUNTER — Encounter (HOSPITAL_COMMUNITY): Payer: Self-pay

## 2018-12-09 ENCOUNTER — Inpatient Hospital Stay (HOSPITAL_COMMUNITY): Payer: Commercial Managed Care - PPO | Admitting: Anesthesiology

## 2018-12-09 ENCOUNTER — Inpatient Hospital Stay (HOSPITAL_COMMUNITY): Payer: Commercial Managed Care - PPO

## 2018-12-09 ENCOUNTER — Other Ambulatory Visit: Payer: Self-pay

## 2018-12-09 DIAGNOSIS — O139 Gestational [pregnancy-induced] hypertension without significant proteinuria, unspecified trimester: Secondary | ICD-10-CM | POA: Diagnosis present

## 2018-12-09 LAB — COMPREHENSIVE METABOLIC PANEL
ALT: 11 U/L (ref 0–44)
ANION GAP: 6 (ref 5–15)
AST: 18 U/L (ref 15–41)
Albumin: 2.7 g/dL — ABNORMAL LOW (ref 3.5–5.0)
Alkaline Phosphatase: 156 U/L — ABNORMAL HIGH (ref 38–126)
BUN: 6 mg/dL (ref 6–20)
CO2: 18 mmol/L — ABNORMAL LOW (ref 22–32)
Calcium: 9 mg/dL (ref 8.9–10.3)
Chloride: 111 mmol/L (ref 98–111)
Creatinine, Ser: 0.68 mg/dL (ref 0.44–1.00)
GFR calc Af Amer: >60 mL/min (ref >60)
GFR calc non Af Amer: >60 mL/min (ref >60)
Glucose, Bld: 125 mg/dL — ABNORMAL HIGH (ref 70–99)
Potassium: 3.7 mmol/L (ref 3.5–5.1)
Sodium: 135 mmol/L (ref 135–145)
Total Bilirubin: 0.4 mg/dL (ref 0.3–1.2)
Total Protein: 5.7 g/dL — ABNORMAL LOW (ref 6.5–8.1)

## 2018-12-09 LAB — CBC
HCT: 37.1 % (ref 36.0–46.0)
HCT: 36.4 % (ref 36.0–46.0)
Hemoglobin: 11.7 g/dL — ABNORMAL LOW (ref 12.0–15.0)
Hemoglobin: 11.4 g/dL — ABNORMAL LOW (ref 12.0–15.0)
MCH: 29 pg (ref 26.0–34.0)
MCH: 28.8 pg (ref 26.0–34.0)
MCHC: 31.5 g/dL (ref 30.0–36.0)
MCHC: 31.3 g/dL (ref 30.0–36.0)
MCV: 91.8 fL (ref 80.0–100.0)
MCV: 91.9 fL (ref 80.0–100.0)
NRBC: 0 % (ref 0.0–0.2)
Platelets: 200 10*3/uL (ref 150–400)
Platelets: 195 10*3/uL (ref 150–400)
RBC: 4.04 MIL/uL (ref 3.87–5.11)
RBC: 3.96 MIL/uL (ref 3.87–5.11)
RDW: 17.3 % — ABNORMAL HIGH (ref 11.5–15.5)
RDW: 17.2 % — ABNORMAL HIGH (ref 11.5–15.5)
WBC: 12.5 10*3/uL — ABNORMAL HIGH (ref 4.0–10.5)
WBC: 14.9 10*3/uL — AB (ref 4.0–10.5)
nRBC: 0 % (ref 0.0–0.2)

## 2018-12-09 LAB — PROTEIN / CREATININE RATIO, URINE
Creatinine, Urine: 58.1 mg/dL
Total Protein, Urine: <6 mg/dL

## 2018-12-09 LAB — TYPE AND SCREEN
ABO/RH(D): O POS
Antibody Screen: NEGATIVE

## 2018-12-09 LAB — ABO/RH: ABO/RH(D): O POS

## 2018-12-09 MED ORDER — HYDROXYZINE HCL 50 MG PO TABS
25.0000 mg | ORAL_TABLET | Freq: Once | ORAL | Status: AC
  Administered 2018-12-09: 25 mg via ORAL
  Filled 2018-12-09: qty 1

## 2018-12-09 MED ORDER — PHENYLEPHRINE 40 MCG/ML (10ML) SYRINGE FOR IV PUSH (FOR BLOOD PRESSURE SUPPORT): 80.0000 ug | PREFILLED_SYRINGE | INTRAVENOUS | Status: DC | PRN

## 2018-12-09 MED ORDER — MISOPROSTOL 25 MCG QUARTER TABLET: 25.0000 ug | ORAL_TABLET | ORAL | Status: DC | PRN

## 2018-12-09 MED ORDER — LACTATED RINGERS IV SOLN
INTRAVENOUS | Status: DC
  Administered 2018-12-09 (×3): via INTRAVENOUS

## 2018-12-09 MED ORDER — ACETAMINOPHEN 325 MG PO TABS: 650.0000 mg | ORAL_TABLET | ORAL | Status: DC | PRN

## 2018-12-09 MED ORDER — OXYTOCIN 40 UNITS IN NORMAL SALINE INFUSION - SIMPLE MED
1.0000 m[IU]/min | INTRAVENOUS | Status: DC
  Administered 2018-12-09: 2 m[IU]/min via INTRAVENOUS
  Filled 2018-12-09: qty 1000

## 2018-12-09 MED ORDER — FENTANYL-BUPIVACAINE-NACL 0.5-0.125-0.9 MG/250ML-% EP SOLN
12.0000 mL/h | EPIDURAL | Status: DC | PRN
  Filled 2018-12-09 (×2): qty 250

## 2018-12-09 MED ORDER — EPHEDRINE 5 MG/ML INJ: 10.0000 mg | INTRAVENOUS | Status: DC | PRN

## 2018-12-09 MED ORDER — OXYTOCIN BOLUS FROM INFUSION
500.0000 mL | Freq: Once | INTRAVENOUS | Status: AC
  Administered 2018-12-10: 500 mL via INTRAVENOUS

## 2018-12-09 MED ORDER — OXYCODONE-ACETAMINOPHEN 5-325 MG PO TABS: 2.0000 | ORAL_TABLET | ORAL | Status: DC | PRN

## 2018-12-09 MED ORDER — OXYTOCIN 40 UNITS IN NORMAL SALINE INFUSION - SIMPLE MED
2.5000 [IU]/h | INTRAVENOUS | Status: DC
  Administered 2018-12-10: 2.5 [IU]/h via INTRAVENOUS
  Filled 2018-12-09: qty 1000

## 2018-12-09 MED ORDER — SOD CITRATE-CITRIC ACID 500-334 MG/5ML PO SOLN: 30.0000 mL | ORAL | Status: DC | PRN

## 2018-12-09 MED ORDER — FENTANYL CITRATE (PF) 100 MCG/2ML IJ SOLN
INTRAMUSCULAR | Status: AC
  Filled 2018-12-09: qty 2

## 2018-12-09 MED ORDER — LACTATED RINGERS IV SOLN: 500.0000 mL | INTRAVENOUS | Status: DC | PRN

## 2018-12-09 MED ORDER — ONDANSETRON HCL 4 MG/2ML IJ SOLN
4.0000 mg | Freq: Four times a day (QID) | INTRAMUSCULAR | Status: DC | PRN
  Administered 2018-12-09: 4 mg via INTRAVENOUS
  Filled 2018-12-09: qty 2

## 2018-12-09 MED ORDER — LIDOCAINE HCL (PF) 1 % IJ SOLN
INTRAMUSCULAR | Status: DC | PRN
  Administered 2018-12-09 (×2): 6 mL via EPIDURAL
  Administered 2018-12-10: 8 mL

## 2018-12-09 MED ORDER — LACTATED RINGERS IV SOLN
500.0000 mL | Freq: Once | INTRAVENOUS | Status: AC
  Administered 2018-12-09: 500 mL via INTRAVENOUS

## 2018-12-09 MED ORDER — TERBUTALINE SULFATE 1 MG/ML IJ SOLN: 0.2500 mg | Freq: Once | INTRAMUSCULAR | Status: DC | PRN

## 2018-12-09 MED ORDER — SODIUM CHLORIDE (PF) 0.9 % IJ SOLN
INTRAMUSCULAR | Status: DC | PRN
  Administered 2018-12-09: 12 mL/h via EPIDURAL

## 2018-12-09 MED ORDER — FENTANYL CITRATE (PF) 100 MCG/2ML IJ SOLN
INTRAMUSCULAR | Status: DC | PRN
  Administered 2018-12-09 (×2): 50 ug via INTRAVENOUS

## 2018-12-09 MED ORDER — PHENYLEPHRINE 40 MCG/ML (10ML) SYRINGE FOR IV PUSH (FOR BLOOD PRESSURE SUPPORT)
80.0000 ug | PREFILLED_SYRINGE | INTRAVENOUS | Status: DC | PRN
  Filled 2018-12-09 (×2): qty 10

## 2018-12-09 MED ORDER — DIPHENHYDRAMINE HCL 50 MG/ML IJ SOLN: 12.5000 mg | INTRAMUSCULAR | Status: DC | PRN

## 2018-12-09 MED ORDER — LIDOCAINE HCL (PF) 1 % IJ SOLN
30.0000 mL | INTRAMUSCULAR | Status: AC | PRN
  Administered 2018-12-10: 30 mL via SUBCUTANEOUS
  Filled 2018-12-09: qty 30

## 2018-12-09 MED ORDER — OXYCODONE-ACETAMINOPHEN 5-325 MG PO TABS: 1.0000 | ORAL_TABLET | ORAL | Status: DC | PRN

## 2018-12-09 MED ORDER — BUPIVACAINE HCL (PF) 0.25 % IJ SOLN
INTRAMUSCULAR | Status: DC | PRN
  Administered 2018-12-09 – 2018-12-10 (×4): 5 mL via EPIDURAL

## 2018-12-09 NOTE — Anesthesia Procedure Notes (Signed)
Epidural Patient location during procedure: OB Start time: 12/09/2018 12:22 PM End time: 12/09/2018 12:26 PM  Staffing Anesthesiologist: Leilani Able, MD Performed: anesthesiologist   Preanesthetic Checklist Completed: patient identified, site marked, surgical consent, pre-op evaluation, timeout performed, IV checked, risks and benefits discussed and monitors and equipment checked  Epidural Patient position: sitting Prep: site prepped and draped and DuraPrep Patient monitoring: continuous pulse ox and blood pressure Approach: midline Location: L3-L4 Injection technique: LOR air  Needle:  Needle type: Tuohy  Needle gauge: 17 G Needle length: 9 cm and 9 Needle insertion depth: 6 cm Catheter type: closed end flexible Catheter size: 19 Gauge Catheter at skin depth: 11 cm Test dose: negative and Other  Assessment Sensory level: T9 Events: blood not aspirated, injection not painful, no injection resistance, negative IV test and no paresthesia  Additional Notes Reason for block:procedure for pain

## 2018-12-09 NOTE — Progress Notes (Signed)
Nyhla SANYAH HULL is a 29 y.o. G2P0010 at [redacted]w[redacted]d by ultrasound admitted for induction of labor due to Hypertension.  Subjective:   Objective: BP (!) 144/58   Pulse 90   Temp 99.5 F (37.5 C) (Axillary)   Resp 18   Ht 5\' 6"  (1.676 m)   Wt 125.6 kg   LMP 03/04/2018 (Exact Date)   SpO2 100%   BMI 44.71 kg/m  I/O last 3 completed shifts: In: -  Out: 1650 [Urine:1650] No intake/output data recorded.  FHT:  FHR: 140 bpm, variability: moderate,  accelerations:  Present,  decelerations:  Absent UC:   regular, every 1-3 minutes SVE:   Dilation: 6.5 Effacement (%): 100 Station: -1 Exam by:: Shawnie Pons MD  Labs: Lab Results  Component Value Date   WBC 12.5 (H) 12/09/2018   HGB 11.4 (L) 12/09/2018   HCT 36.4 12/09/2018   MCV 91.9 12/09/2018   PLT 195 12/09/2018    Assessment / Plan: Induction of labor due to gestational hypertension,  progressing well on pitocin  Labor: slow progress, LGA baby and possible OP on exam Fetal Wellbeing:  Category I Pain Control:  Epidural I/D:  GBS neg Anticipated MOD:  NSVD guarded. Slow change. Recheck 10:30-11 pm  Reva Bores 12/09/2018, 9:30 PM

## 2018-12-09 NOTE — Anesthesia Procedure Notes (Deleted)
Epidural

## 2018-12-09 NOTE — Progress Notes (Signed)
Labor Progress Note Emily Carpenter is a 28 y.o. G2P0010 at [redacted]w[redacted]d  admitted for IOL for gHTN and LGA.  S:  Comfortable and currently on Pitocin. She denies HA, SOB, RUQ no LE swelling or pain   O:  BP 130/72   Pulse (!) 105   Temp 98.5 F (36.9 C) (Oral)   Resp 18   Ht 5\' 6"  (1.676 m)   Wt 125.6 kg   LMP 03/04/2018 (Exact Date)   BMI 44.71 kg/m  EFM: baseline 150 bpm/ >6bpm variability/ + accels/ - decels  Toco: 1 every 2-3 mine SVE: Dilation: 5 Effacement (%): 60, 70 Station: -3 Presentation: Vertex Exam by:: Lexie Ament, RN Pitocin: 14 mu/min  A/P: 28 y.o. G2P0010 [redacted]w[redacted]d  1. Labor: Latent  2. FWB: Cat 1 3. Pain: Mild   -Anticipate SVD with IOL -Last check at 0615  -Recheck at 1015 and evaluate for possible AROM  -Expectant management  -Patient does desire Epidural when requested    Sandi Raveling, MD PGY 1 Family Medicine Resident [facility] [place]  9:53 AM

## 2018-12-09 NOTE — Progress Notes (Signed)
LABOR PROGRESS NOTE  Emily Carpenter is a 28 y.o. G2P0010 at [redacted]w[redacted]d  admitted for IOL for gHTN and LGA.  Subjective: Strip Note.  Objective: BP 131/67   Pulse 92   Temp 98.6 F (37 C) (Oral)   Resp 17   Ht 5\' 6"  (1.676 m)   Wt 125.6 kg   LMP 03/04/2018 (Exact Date)   BMI 44.71 kg/m  or  Vitals:   12/09/18 0230 12/09/18 0302 12/09/18 0332 12/09/18 0402  BP: 140/84 122/67 128/68 131/67  Pulse: 89 (!) 101 (!) 106 92  Resp:  17  17  Temp:      TempSrc:      Weight:      Height:       Dilation: 4 Effacement (%): 50 Station: -3 Presentation: Vertex Exam by:: Ishmail Mcmanamon, MD FHT: baseline rate 140bpm, moderate varibility, + acel, no decel Toco: ctx q2-3 mins  Labs: Lab Results  Component Value Date   WBC 14.9 (H) 12/09/2018   HGB 11.7 (L) 12/09/2018   HCT 37.1 12/09/2018   MCV 91.8 12/09/2018   PLT 200 12/09/2018    Patient Active Problem List   Diagnosis Date Noted  . Gestational hypertension 12/09/2018  . Gestational hypertension, third trimester 11/08/2018  . Supervision of high risk pregnancy, antepartum 05/10/2018    Assessment / Plan: 29 y.o. G2P0010 at [redacted]w[redacted]d here for IOL for gHTN and LGA.  Labor: Continue pitocin 2x2 Fetal Wellbeing:  Cat I Pain Control:  IV pain meds/Epidural upon request Anticipated MOD:  NSVD GHTN: Normotensive. Continue to monitor. Mag if develops severe pressures/features. IV Hydral/Labetolol if >160/>110.  [name], D.O. [facility], PGY1 12/09/2018, 4:05 AM

## 2018-12-09 NOTE — Progress Notes (Signed)
Patient reports foley bulb fell out around 2130 12/08/18  Lenox Ponds, RN

## 2018-12-09 NOTE — Anesthesia Preprocedure Evaluation (Addendum)
Anesthesia Evaluation  Patient identified by MRN, date of birth, ID band Patient awake    Reviewed: Allergy & Precautions, H&P , NPO status , Patient's Chart, lab work & pertinent test results  Airway Mallampati: III  TM Distance: >3 FB Neck ROM: full    Dental no notable dental hx. (+) Teeth Intact   Pulmonary neg pulmonary ROS,    Pulmonary exam normal breath sounds clear to auscultation       Cardiovascular Normal cardiovascular exam Rhythm:regular Rate:Normal     Neuro/Psych negative neurological ROS  negative psych ROS   GI/Hepatic negative GI ROS, Neg liver ROS,   Endo/Other  Morbid obesity  Renal/GU negative Renal ROS  negative genitourinary   Musculoskeletal negative musculoskeletal ROS (+)   Abdominal (+) + obese,   Peds  Hematology negative hematology ROS (+)   Anesthesia Other Findings   Reproductive/Obstetrics (+) Pregnancy                             Anesthesia Physical Anesthesia Plan  ASA: III  Anesthesia Plan: Epidural   Post-op Pain Management:    Induction:   PONV Risk Score and Plan:   Airway Management Planned:   Additional Equipment:   Intra-op Plan:   Post-operative Plan:   Informed Consent: I have reviewed the patients History and Physical, chart, labs and discussed the procedure including the risks, benefits and alternatives for the proposed anesthesia with the patient or authorized representative who has indicated his/her understanding and acceptance.       Plan Discussed with:   Anesthesia Plan Comments:         Anesthesia Quick Evaluation

## 2018-12-09 NOTE — Progress Notes (Signed)
Labor Progress Note Emily Carpenter is a 28 y.o. G2P0010 at [redacted]w[redacted]d  admitted for IOL for gHTN and LGA.  S:  Comfortable and currently on Pitocin and had AROM at 1100hrs. She denies HA, SOB, RUQ no LE swelling or pain   O:  BP 133/88   Pulse (!) 117   Temp 98.5 F (36.9 C) (Oral)   Resp 18   Ht 5\' 6"  (1.676 m)   Wt 125.6 kg   LMP 03/04/2018 (Exact Date)   BMI 44.71 kg/m  EFM: baseline 150 bpm/ >6bpm variability/ + accels/ - decels  Toco: 1 every 2-3 mine SVE: Dilation: 6 Effacement (%): 60, 70 Station: -1 Presentation: Vertex Exam by:: resident Dr.  Hale Bogus: 14 mu/min  A/P: 28 y.o. G2P0010 [redacted]w[redacted]d  1. Labor: Latent  2. FWB: Cat 1 3. Pain: Mild   -Anticipate SVD with IOL -Expectant management  -Had AROM @ 1100hrs  -Patient does desire Epidural when requested  -Recheck @ 1400hrs   Sandi Raveling, MD PGY 1 Family Medicine Resident [facility] [place]  12:02 PM

## 2018-12-09 NOTE — Progress Notes (Signed)
Labor Progress Note Emily Carpenter is a 28 y.o. G2P0010 at [redacted]w[redacted]d presented for IOL for gHTN and LGA  S:  Patient still reporting increased pressure in pelvis. No change from earlier. Denies HA, visual changes or epigastric pain.  O:  BP 134/85   Pulse 83   Temp 98.5 F (36.9 C) (Oral)   Resp 18   Ht 5\' 6"  (1.676 m)   Wt 125.6 kg   LMP 03/04/2018 (Exact Date)   SpO2 100%   BMI 44.71 kg/m   Fetal Tracing:  Baseline: 150 Variability: moderate Accels: 15x15 Decels: none  Toco: 2-4   CVE: Dilation: 6 Effacement (%): 60 Station: -2 Presentation: Vertex Exam by:: Welford Roche, RNC   A&P: 28 y.o. G2P0010 [redacted]w[redacted]d IOL gHTN and LGA #Labor: Minimal cervical change and difficulty tracing UCs. Discussed with patient IUPC for better monitoring and titration of pitocin. Patient agreeable to plan of care. IUPC placed without difficulty and patient tolerated procedure well.  Lengthy discussion with patient about risks of LGA baby including shoulder dystocia and failure to progress. Patient verbalizes understanding and is grateful for the information.  #Pain: epidural #FWB: Cat 1 #GBS negative  Rolm Bookbinder, CNM 2:51 PM

## 2018-12-09 NOTE — Progress Notes (Signed)
Emily Carpenter is a 28 y.o. G2P0010 at [redacted]w[redacted]d by ultrasound admitted for induction of labor due to Hypertension and Elective at term.  Subjective:   Objective: BP 134/85   Pulse 83   Temp 98.5 F (36.9 C) (Oral)   Resp 18   Ht 5\' 6"  (1.676 m)   Wt 125.6 kg   LMP 03/04/2018 (Exact Date)   SpO2 100%   BMI 44.71 kg/m  No intake/output data recorded. No intake/output data recorded.  FHT:  FHR: 145 bpm, variability: moderate,  accelerations:  Present,  decelerations:  Absent UC:   regular, every 2-3 minutes SVE:   Dilation: 6 Effacement (%): 90 Station: -1 Exam by:: Dr. Shawnie Pons  Labs: Lab Results  Component Value Date   WBC 12.5 (H) 12/09/2018   HGB 11.4 (L) 12/09/2018   HCT 36.4 12/09/2018   MCV 91.9 12/09/2018   PLT 195 12/09/2018    Assessment / Plan: Induction of labor due to gestational hypertension,  progressing well on pitocin  Labor: slow progress and know LGA. there is some thinning. Fetal Wellbeing:  Category I Pain Control:  Epidural I/D:  GBS negative Anticipated MOD:  NSVD guarded Given poor pattern, will decrease pitocin dose by 1/2 to 14 and see if this improves pattern. Re-check at 8:15-8:30 and reassess.  Reva Bores 12/09/2018, 5:49 PM

## 2018-12-09 NOTE — Anesthesia Pain Management Evaluation Note (Signed)
  CRNA Pain Management Visit Note  Patient: Emily Carpenter, 28 y.o., female  "Hello I am a member of the anesthesia team at [facility]. We have an anesthesia team available at all times to provide care throughout the hospital, including epidural management and anesthesia for C-section. I don't know your plan for the delivery whether it a natural birth, water birth, IV sedation, nitrous supplementation, doula or epidural, but we want to meet your pain goals."   1.Was your pain managed to your expectations on prior hospitalizations?   No prior hospitalizations  2.What is your expectation for pain management during this hospitalization?     Epidural  3.How can we help you reach that goal? *support**  Record the patient's initial score and the patient's pain goal.   Pain: 2  Pain Goal: 8 [facility] wants you to be able to say your pain was always managed very well.  Trellis Paganini 12/09/2018

## 2018-12-10 ENCOUNTER — Encounter (HOSPITAL_COMMUNITY): Payer: Self-pay | Admitting: *Deleted

## 2018-12-10 DIAGNOSIS — O134 Gestational [pregnancy-induced] hypertension without significant proteinuria, complicating childbirth: Secondary | ICD-10-CM

## 2018-12-10 DIAGNOSIS — O480 Post-term pregnancy: Secondary | ICD-10-CM

## 2018-12-10 DIAGNOSIS — Z3A40 40 weeks gestation of pregnancy: Secondary | ICD-10-CM

## 2018-12-10 LAB — CBC
HCT: 35.6 % — ABNORMAL LOW (ref 36.0–46.0)
Hemoglobin: 11 g/dL — ABNORMAL LOW (ref 12.0–15.0)
MCH: 28.7 pg (ref 26.0–34.0)
MCHC: 30.9 g/dL (ref 30.0–36.0)
MCV: 93 fL (ref 80.0–100.0)
Platelets: 195 10*3/uL (ref 150–400)
RBC: 3.83 MIL/uL — ABNORMAL LOW (ref 3.87–5.11)
RDW: 17.6 % — ABNORMAL HIGH (ref 11.5–15.5)
WBC: 21.1 10*3/uL — ABNORMAL HIGH (ref 4.0–10.5)
nRBC: 0 % (ref 0.0–0.2)

## 2018-12-10 LAB — RPR: RPR Ser Ql: NONREACTIVE

## 2018-12-10 MED ORDER — COCONUT OIL OIL: 1.0000 "application " | TOPICAL_OIL | Status: DC | PRN

## 2018-12-10 MED ORDER — SENNOSIDES-DOCUSATE SODIUM 8.6-50 MG PO TABS
2.0000 | ORAL_TABLET | ORAL | Status: DC
  Administered 2018-12-10: 2 via ORAL
  Filled 2018-12-10: qty 2

## 2018-12-10 MED ORDER — SIMETHICONE 80 MG PO CHEW: 80.0000 mg | CHEWABLE_TABLET | ORAL | Status: DC | PRN

## 2018-12-10 MED ORDER — OXYCODONE HCL 5 MG PO TABS: 10.0000 mg | ORAL_TABLET | ORAL | Status: DC | PRN

## 2018-12-10 MED ORDER — ONDANSETRON HCL 4 MG PO TABS: 4.0000 mg | ORAL_TABLET | ORAL | Status: DC | PRN

## 2018-12-10 MED ORDER — IBUPROFEN 600 MG PO TABS
600.0000 mg | ORAL_TABLET | Freq: Four times a day (QID) | ORAL | Status: DC
  Administered 2018-12-10 – 2018-12-11 (×5): 600 mg via ORAL
  Filled 2018-12-10 (×5): qty 1

## 2018-12-10 MED ORDER — ONDANSETRON HCL 4 MG/2ML IJ SOLN: 4.0000 mg | INTRAMUSCULAR | Status: DC | PRN

## 2018-12-10 MED ORDER — ACETAMINOPHEN 325 MG PO TABS: 650.0000 mg | ORAL_TABLET | ORAL | Status: DC | PRN

## 2018-12-10 MED ORDER — DIBUCAINE 1 % RE OINT: 1.0000 "application " | TOPICAL_OINTMENT | RECTAL | Status: DC | PRN

## 2018-12-10 MED ORDER — BENZOCAINE-MENTHOL 20-0.5 % EX AERO
1.0000 "application " | INHALATION_SPRAY | CUTANEOUS | Status: DC | PRN
  Administered 2018-12-10: 1 via TOPICAL
  Filled 2018-12-10: qty 56

## 2018-12-10 MED ORDER — PRENATAL MULTIVITAMIN CH
1.0000 | ORAL_TABLET | Freq: Every day | ORAL | Status: DC
  Administered 2018-12-10 – 2018-12-11 (×2): 1 via ORAL
  Filled 2018-12-10 (×2): qty 1

## 2018-12-10 MED ORDER — WITCH HAZEL-GLYCERIN EX PADS
1.0000 "application " | MEDICATED_PAD | CUTANEOUS | Status: DC | PRN
  Administered 2018-12-10 – 2018-12-11 (×2): 1 via TOPICAL

## 2018-12-10 MED ORDER — DIPHENHYDRAMINE HCL 25 MG PO CAPS: 25.0000 mg | ORAL_CAPSULE | Freq: Four times a day (QID) | ORAL | Status: DC | PRN

## 2018-12-10 MED ORDER — OXYCODONE HCL 5 MG PO TABS: 5.0000 mg | ORAL_TABLET | ORAL | Status: DC | PRN

## 2018-12-10 NOTE — Anesthesia Procedure Notes (Signed)
Epidural Patient location during procedure: OB Start time: 12/10/2018 12:37 AM End time: 12/10/2018 12:41 AM  Staffing Anesthesiologist: Leilani Able, MD Performed: anesthesiologist   Preanesthetic Checklist Completed: patient identified, site marked, surgical consent, pre-op evaluation, timeout performed, IV checked, risks and benefits discussed and monitors and equipment checked  Epidural Patient position: sitting Prep: site prepped and draped and DuraPrep Patient monitoring: continuous pulse ox and blood pressure Approach: midline Location: L3-L4 Injection technique: LOR air  Needle:  Needle type: Tuohy  Needle gauge: 17 G Needle length: 9 cm and 9 Needle insertion depth: 7 cm Catheter type: closed end flexible Catheter size: 19 Gauge Catheter at skin depth: 12 cm Test dose: negative and Other  Assessment Sensory level: T10 Events: blood not aspirated, injection not painful, no injection resistance, negative IV test and no paresthesia  Additional Notes Reason for block:procedure for pain

## 2018-12-10 NOTE — Anesthesia Postprocedure Evaluation (Signed)
Anesthesia Post Note  Patient: Ester Rink  Procedure(s) Performed: AN AD HOC LABOR EPIDURAL     Patient location during evaluation: Mother Baby Anesthesia Type: Epidural Level of consciousness: awake, awake and alert and oriented Pain management: pain level controlled Vital Signs Assessment: post-procedure vital signs reviewed and stable Respiratory status: spontaneous breathing, respiratory function stable and nonlabored ventilation Cardiovascular status: blood pressure returned to baseline and stable Postop Assessment: no headache, no backache, epidural receding, able to ambulate, patient able to bend at knees, no apparent nausea or vomiting and adequate PO intake Anesthetic complications: no    Last Vitals:  Vitals:   12/10/18 0940 12/10/18 1420  BP: (!) 143/82 (!) 141/75  Pulse: 97 96  Resp: 18 18  Temp:  36.7 C  SpO2: 99% 100%    Last Pain:  Vitals:   12/10/18 1420  TempSrc: Oral  PainSc:    Pain Goal:                   [employer]

## 2018-12-10 NOTE — Discharge Instructions (Signed)
Vaginal Delivery, Care After °Refer to this sheet in the next few weeks. These instructions provide you with information about caring for yourself after vaginal delivery. Your health care provider may also give you more specific instructions. Your treatment has been planned according to current medical practices, but problems sometimes occur. Call your health care provider if you have any problems or questions. °What can I expect after the procedure? °After vaginal delivery, it is common to have: °· Some bleeding from your vagina. °· Soreness in your abdomen, your vagina, and the area of skin between your vaginal opening and your anus (perineum). °· Pelvic cramps. °· Fatigue. °Follow these instructions at home: °Medicines °· Take over-the-counter and prescription medicines only as told by your health care provider. °· If you were prescribed an antibiotic medicine, take it as told by your health care provider. Do not stop taking the antibiotic until it is finished. °Driving ° °· Do not drive or operate heavy machinery while taking prescription pain medicine. °· Do not drive for 24 hours if you received a sedative. °Lifestyle °· Do not drink alcohol. This is especially important if you are breastfeeding or taking medicine to relieve pain. °· Do not use tobacco products, including cigarettes, chewing tobacco, or e-cigarettes. If you need help quitting, ask your health care provider. °Eating and drinking °· Drink at least 8 eight-ounce glasses of water every day unless you are told not to by your health care provider. If you choose to breastfeed your baby, you may need to drink more water than this. °· Eat high-fiber foods every day. These foods may help prevent or relieve constipation. High-fiber foods include: °? Whole grain cereals and breads. °? Brown rice. °? Beans. °? Fresh fruits and vegetables. °Activity °· Return to your normal activities as told by your health care provider. Ask your health care provider what  activities are safe for you. °· Rest as much as possible. Try to rest or take a nap when your baby is sleeping. °· Do not lift anything that is heavier than your baby or 10 lb (4.5 kg) until your health care provider says that it is safe. °· Talk with your health care provider about when you can engage in sexual activity. This may depend on your: °? Risk of infection. °? Rate of healing. °? Comfort and desire to engage in sexual activity. °Vaginal Care °· If you have an episiotomy or a vaginal tear, check the area every day for signs of infection. Check for: °? More redness, swelling, or pain. °? More fluid or blood. °? Warmth. °? Pus or a bad smell. °· Do not use tampons or douches until your health care provider says this is safe. °· Watch for any blood clots that may pass from your vagina. These may look like clumps of dark red, brown, or black discharge. °General instructions °· Keep your perineum clean and dry as told by your health care provider. °· Wear loose, comfortable clothing. °· Wipe from front to back when you use the toilet. °· Ask your health care provider if you can shower or take a bath. If you had an episiotomy or a perineal tear during labor and delivery, your health care provider may tell you not to take baths for a certain length of time. °· Wear a bra that supports your breasts and fits you well. °· If possible, have someone help you with household activities and help care for your baby for at least a few days after you   leave the hospital. °· Keep all follow-up visits for you and your baby as told by your health care provider. This is important. °Contact a health care provider if: °· You have: °? Vaginal discharge that has a bad smell. °? Difficulty urinating. °? Pain when urinating. °? A sudden increase or decrease in the frequency of your bowel movements. °? More redness, swelling, or pain around your episiotomy or vaginal tear. °? More fluid or blood coming from your episiotomy or vaginal  tear. °? Pus or a bad smell coming from your episiotomy or vaginal tear. °? A fever. °? A rash. °? Little or no interest in activities you used to enjoy. °? Questions about caring for yourself or your baby. °· Your episiotomy or vaginal tear feels warm to the touch. °· Your episiotomy or vaginal tear is separating or does not appear to be healing. °· Your breasts are painful, hard, or turn red. °· You feel unusually sad or worried. °· You feel nauseous or you vomit. °· You pass large blood clots from your vagina. If you pass a blood clot from your vagina, save it to show to your health care provider. Do not flush blood clots down the toilet without having your health care provider look at them. °· You urinate more than usual. °· You are dizzy or light-headed. °· You have not breastfed at all and you have not had a menstrual period for 12 weeks after delivery. °· You have stopped breastfeeding and you have not had a menstrual period for 12 weeks after you stopped breastfeeding. °Get help right away if: °· You have: °? Pain that does not go away or does not get better with medicine. °? Chest pain. °? Difficulty breathing. °? Blurred vision or spots in your vision. °? Thoughts about hurting yourself or your baby. °· You develop pain in your abdomen or in one of your legs. °· You develop a severe headache. °· You faint. °· You bleed from your vagina so much that you fill two sanitary pads in one hour. °This information is not intended to replace advice given to you by your health care provider. Make sure you discuss any questions you have with your health care provider. °Document Released: 09/17/2000 Document Revised: 03/03/2016 Document Reviewed: 10/05/2015 °Elsevier Interactive Patient Education © 2019 [employer] ° °

## 2018-12-10 NOTE — Discharge Summary (Addendum)
Postpartum Discharge Summary     Patient Name: Emily Carpenter DOB: 09/12/1991 MRN: [id number]  Date of admission: 12/08/2018 Delivering Provider: Joana Reamer   Date of discharge: 12/11/2018  Admitting diagnosis: induction Intrauterine pregnancy: [redacted]w[redacted]d     Secondary diagnosis:  Active Problems:   Gestational hypertension  Additional problems:LGA infant      Discharge diagnosis: Term Pregnancy Delivered and Gestational Hypertension                                                                                                Post partum procedures:Repair of PP Vaginal Laceration 2nd degree  Augmentation: AROM and Pitocin  Complications: None  Hospital course:  Induction of Labor With Vaginal Delivery   28 y.o. yo G2P0010 at [redacted]w[redacted]d was admitted to the hospital 12/08/2018 for induction of labor.  Indication for induction: Gestational hypertension.  Patient had an uncomplicated labor course as follows: Membrane Rupture Time/Date: 11:08 AM ,12/09/2018   Intrapartum Procedures: Episiotomy: None [1]                                         Lacerations:  2nd degree [3];Perineal [11]  Patient had delivery of a Viable infant. With APGAR: 8/9 with nucal cord x1 and had a 2nd degree perineal laceration reparied with 3.0 Vicryl.  Information for the patient's newborn:  Tamerra, Brenan [[id number]]  Delivery Method: Vaginal, Spontaneous(Filed from Delivery Summary)   12/10/2018  Details of delivery can be found in separate delivery note.  Patient had a routine postpartum course. Patient is discharged home 12/11/18.  Magnesium Sulfate recieved: No BMZ received: No  Physical exam  Vitals:   12/10/18 1821 12/10/18 2206 12/11/18 0302 12/11/18 0529  BP: (!) 145/80 125/77 119/71 113/61  Pulse: 91 95 73 76  Resp: 20 20 20 20   Temp: 97.8 F (36.6 C)  97.9 F (36.6 C) 98.2 F (36.8 C)  TempSrc: Oral Oral Oral Oral  SpO2: 100%     Weight:      Height:       General: alert,  cooperative and no distress Lochia: appropriate Uterine Fundus: firm Incision: N/A DVT Evaluation: No cords or calf tenderness. No significant calf/ankle edema. Labs: Lab Results  Component Value Date   WBC 21.1 (H) 12/10/2018   HGB 11.0 (L) 12/10/2018   HCT 35.6 (L) 12/10/2018   MCV 93.0 12/10/2018   PLT 195 12/10/2018   CMP Latest Ref Rng & Units 12/09/2018  Glucose 70 - 99 mg/dL 026(V)  BUN 6 - 20 mg/dL 6  Creatinine 7.85 - 8.85 mg/dL 0.27  Sodium 741 - 287 mmol/L 135  Potassium 3.5 - 5.1 mmol/L 3.7  Chloride 98 - 111 mmol/L 111  CO2 22 - 32 mmol/L 18(L)  Calcium 8.9 - 10.3 mg/dL 9.0  Total Protein 6.5 - 8.1 g/dL 8.6(V)  Total Bilirubin 0.3 - 1.2 mg/dL 0.4  Alkaline Phos 38 - 126 U/L 156(H)  AST 15 - 41 U/L 18  ALT 0 - 44  U/L 11    Discharge instruction: per After Visit Summary and "Baby and Me Booklet".  After visit meds:  Allergies as of 12/11/2018   No Known Allergies     Medication List    STOP taking these medications   OB Complete Petite 35-5-1-200 MG Caps     TAKE these medications   acetaminophen 325 MG tablet Commonly known as:  Tylenol Take 2 tablets (650 mg total) by mouth every 4 (four) hours as needed (for pain scale < 4).   enalapril 10 MG tablet Commonly known as:  VASOTEC Take 1 tablet (10 mg total) by mouth daily.   ferrous sulfate 325 (65 FE) MG tablet Take 325 mg by mouth daily with breakfast.   ibuprofen 600 MG tablet Commonly known as:  ADVIL,MOTRIN Take 1 tablet (600 mg total) by mouth every 6 (six) hours.   oxyCODONE 5 MG immediate release tablet Commonly known as:  Oxy IR/ROXICODONE Take 1 tablet (5 mg total) by mouth every 4 (four) hours as needed for up to 5 days (pain scale 4-7).   polyethylene glycol packet Commonly known as:  MiraLax Take 17 g by mouth daily.   senna-docusate 8.6-50 MG tablet Commonly known as:  Senokot-S Take 2 tablets by mouth daily for 10 days. Start taking on:  December 12, 2018   TUMS PO Take 2  tablets by mouth 2 (two) times daily as needed (heartburn).       Diet: routine diet  Activity: Advance as tolerated. Pelvic rest for 6 weeks.   Outpatient follow up: BP check 12/15/18 Follow up Appt: Future Appointments  Date Time Provider Department Center  12/15/2018 10:30 AM [facility] NURSE [facility]  01/19/2019 10:30 AM Cheral Marker, CNM [facility]   Follow up Visit:   Please schedule this patient for Postpartum visit in: 1 week with the following provider: MD For C/S patients schedule nurse incision check in weeks 2 weeks: no High risk pregnancy complicated by: HTN Delivery mode:  SVD Anticipated Birth Control:  IUD PP Procedures needed: BP check  Schedule Integrated BH visit: no      Newborn Data: Live born female  Birth Weight:   APGAR: 8, 9  Newborn Delivery   Birth date/time:  12/10/2018 05:08:00 Delivery type:  Vaginal, Spontaneous     Baby Feeding: Breast Disposition:home with mother   12/11/2018 Sandi Raveling, MD PGY 1 Family Medicine [facility] [place]   I was present for the exam and agree with above. Pt had few mildly elevated BP's. No evidence of Pre-E. Pt highly monivated to be discharged. 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 BP check scheduled 3/13. Started Enalapril. Pre-E precautions.    Katrinka Blazing, [place], CNM 12/13/2018 10:53 AM

## 2018-12-10 NOTE — Lactation Note (Signed)
This note was copied from a baby's chart. Lactation Consultation Note  Patient Name: Emily Carpenter [id number] Date: 12/10/2018 Reason for consult: Initial assessment;Primapara;1st time breastfeeding;Term  8 hours old FT female who is being exclusively BF by her mother, she's a P1. Mom didn't take BF classes during the pregnancy but she said that she read a lot about BF and lactation. LC showed mom how to hand express, and colostrum was easily flowing off her nipples, reassured mom that baby only needs drops of colostrum throughout the day; she thought she didn't have enough. Mom has a DEBP at home that she brought to the hospital.  Baby swaddled and asleep on mom's chest when entering the room, offered assistance with latch but mom politely declined stating that baby already fed. Asked mom to call for assistance when needed. Parents were very engaged during lactation consultation and had lots of questions. Reviewed normal newborn behavior, cluster feeding and feeding cues, pumping, going back to work and oversupply.  Feeding plan:  1. Encouraged mom to feed baby STS 8-12 times/24 hours or sooner if feeding cues are present 2. Hand expression and spoon feeding were also encouraged  BF brochure and feeding diary were reviewed. Parents reported all questions and concerns were answered, they're both aware of LC services and will call PRN.  Maternal Data Formula Feeding for Exclusion: No Has patient been taught Hand Expression?: Yes Does the patient have breastfeeding experience prior to this delivery?: No  Feeding Feeding Type: Breast Fed   Interventions Interventions: Breast feeding basics reviewed;Breast massage;Hand express;Breast compression  Lactation Tools Discussed/Used WIC Program: No   Consult Status Consult Status: Follow-up Date: 12/11/18 Follow-up type: In-patient    Emily Carpenter Venetia Constable 12/10/2018, 1:41 PM

## 2018-12-11 ENCOUNTER — Encounter (HOSPITAL_COMMUNITY): Payer: Self-pay

## 2018-12-11 MED ORDER — ACETAMINOPHEN 325 MG PO TABS: 650.0000 mg | ORAL_TABLET | ORAL | 0 refills | Status: DC | PRN

## 2018-12-11 MED ORDER — IBUPROFEN 600 MG PO TABS: 600.0000 mg | ORAL_TABLET | Freq: Four times a day (QID) | ORAL | 0 refills | Status: DC

## 2018-12-11 MED ORDER — POLYETHYLENE GLYCOL 3350 17 G PO PACK: 17.0000 g | PACK | Freq: Every day | ORAL | 0 refills | Status: DC

## 2018-12-11 MED ORDER — OXYCODONE HCL 5 MG PO TABS: 5.0000 mg | ORAL_TABLET | ORAL | 0 refills | Status: AC | PRN

## 2018-12-11 MED ORDER — ENALAPRIL MALEATE 10 MG PO TABS: 10.0000 mg | ORAL_TABLET | Freq: Every day | ORAL | 11 refills | Status: DC

## 2018-12-11 MED ORDER — SENNOSIDES-DOCUSATE SODIUM 8.6-50 MG PO TABS: 2.0000 | ORAL_TABLET | ORAL | 0 refills | Status: AC

## 2018-12-11 NOTE — Lactation Note (Signed)
This note was copied from a baby's chart. Lactation Consultation Note  Patient Name: Emily Carpenter [id number] Date: 12/11/2018 Reason for consult: Follow-up assessment Baby is 68 hours old.  Mom is packed up and ready for discharge.  She states breastfeeding is going well but baby prefers one breast.  She continues to offer both and is able to obtain successful latches.  Discussed milk coming to volume and the prevention and treatment of engorgement.  Lactation outpatient services and support reviewed and encouraged prn.  Mom also plans on utilizing the lactation consultant at the pediatricians.  Maternal Data    Feeding Feeding Type: Breast Fed  LATCH Score                   Interventions    Lactation Tools Discussed/Used     Consult Status Consult Status: Complete Follow-up type: Call as needed    Huston Foley 12/11/2018, 12:24 PM

## 2018-12-11 NOTE — Progress Notes (Signed)
Post Partum Day 1 G2P0011 who delivered via NSVD 2/2 IOL due to gHTN   Subjective: no complaints, up ad lib, voiding and tolerating PO  Denies HA, Visual Symptoms, RUQ pain , SOB, LE swelling is gone and she reports only mild pain where her vulvovaginal repair took place.   Objective: Blood pressure 113/61, pulse 76, temperature 98.2 F (36.8 C), temperature source Oral, resp. rate 20, height 5\' 6"  (1.676 m), weight 125.6 kg, last menstrual period 03/04/2018, SpO2 100 %, unknown if currently breastfeeding.  Physical Exam:  General: alert, cooperative and no distress Lochia: appropriate Uterine Fundus: firm DVT Evaluation: No cords or calf tenderness. No significant calf/ankle edema.  Recent Labs    12/09/18 1042 12/10/18 0702  HGB 11.4* 11.0*  HCT 36.4 35.6*    Assessment/Plan: Plan for discharge tomorrow but possible candidate for early discharge, she has a good amount of family support and is GBS - she does not show any Pre-E alarm signs and has not had severe range pressures.   LOS: 3 days   Sandi Raveling MD PGY 1 Family Medicine [facility] [place]  12/11/2018, 7:31 AM

## 2018-12-12 ENCOUNTER — Encounter: Payer: Commercial Managed Care - PPO | Admitting: Obstetrics & Gynecology

## 2018-12-12 ENCOUNTER — Other Ambulatory Visit: Payer: Commercial Managed Care - PPO

## 2018-12-15 ENCOUNTER — Other Ambulatory Visit: Payer: Commercial Managed Care - PPO | Admitting: Obstetrics & Gynecology

## 2018-12-15 ENCOUNTER — Other Ambulatory Visit: Payer: Self-pay

## 2018-12-15 ENCOUNTER — Other Ambulatory Visit: Payer: Commercial Managed Care - PPO

## 2018-12-15 ENCOUNTER — Ambulatory Visit: Payer: Commercial Managed Care - PPO

## 2018-12-15 VITALS — BP 126/81 | HR 99 | Ht 66.0 in | Wt 257.0 lb

## 2018-12-15 DIAGNOSIS — Z0130 Encounter for examination of blood pressure without abnormal findings: Secondary | ICD-10-CM

## 2018-12-15 NOTE — Progress Notes (Signed)
Pt here for blood pressure check 126/81 pulse 99.stop taking Vasotec,blood pressure normal. Spoke Rodena Piety about reading. Perfect.no need take B/P medication. Has appointment 01-19-19 for pp visit.Pad CMA

## 2018-12-19 ENCOUNTER — Other Ambulatory Visit: Payer: Commercial Managed Care - PPO

## 2018-12-19 ENCOUNTER — Encounter: Payer: Commercial Managed Care - PPO | Admitting: Advanced Practice Midwife

## 2018-12-22 ENCOUNTER — Other Ambulatory Visit: Payer: Commercial Managed Care - PPO | Admitting: Women's Health

## 2018-12-26 ENCOUNTER — Other Ambulatory Visit: Payer: Commercial Managed Care - PPO

## 2018-12-26 ENCOUNTER — Encounter: Payer: Commercial Managed Care - PPO | Admitting: Women's Health

## 2018-12-29 ENCOUNTER — Other Ambulatory Visit: Payer: Self-pay | Admitting: Obstetrics & Gynecology

## 2019-01-02 ENCOUNTER — Encounter: Payer: Self-pay | Admitting: Obstetrics & Gynecology

## 2019-01-02 ENCOUNTER — Other Ambulatory Visit: Payer: Self-pay

## 2019-01-05 ENCOUNTER — Other Ambulatory Visit: Payer: Self-pay | Admitting: Obstetrics & Gynecology

## 2019-01-17 ENCOUNTER — Encounter: Payer: Self-pay | Admitting: *Deleted

## 2019-01-19 ENCOUNTER — Ambulatory Visit (INDEPENDENT_AMBULATORY_CARE_PROVIDER_SITE_OTHER): Payer: Commercial Managed Care - PPO | Admitting: Women's Health

## 2019-01-19 ENCOUNTER — Other Ambulatory Visit: Payer: Self-pay

## 2019-01-19 ENCOUNTER — Encounter: Payer: Self-pay | Admitting: Women's Health

## 2019-01-19 NOTE — Progress Notes (Signed)
TELEHEALTH VIRTUAL POSTPARTUM VISIT ENCOUNTER NOTE Patient name: Emily Carpenter MRN [id number]  Date of birth: 10/04/1990  I connected with patient on 01/19/19 at 10:30 AM EDT by [facility] and verified that I am speaking with the correct person using two identifiers. Due to COVID-19 recommendations, pt is not currently in our office.    I discussed the limitations, risks, security and privacy concerns of performing an evaluation and management service by telephone and the availability of in person appointments. I also discussed with the patient that there may be a patient responsible charge related to this service. The patient expressed understanding and agreed to proceed.  Chief Complaint:   Postpartum Care  History of Present Illness:   Emily Carpenter is a 28 y.o. 342P1011 Caucasian female being evaluated today for a postpartum visit. She is 5 weeks postpartum following a spontaneous vaginal delivery at 40.0 gestational weeks after IOL for GHTN/LGA. Anesthesia: epidural. Laceration: 2nd degree. I have fully reviewed the prenatal and intrapartum course. Pregnancy complicated by GHTN. Postpartum course has been uncomplicated. Bleeding no bleeding. Bowel function is normal. Bladder function is normal.  Patient is sexually active. Last sexual activity: 3/27.  Contraception method is coitus interruptus and wants Liletta IUD.  Last pap 10/31/17.  Results were normal .  No LMP recorded.  Baby's course has been uncomplicated. Baby is feeding by breast.   Inocente SallesEdinburgh Postpartum Depression Screening: negative Edinburgh Postnatal Depression Scale - 01/19/19 1029      Edinburgh Postnatal Depression Scale:  In the Past 7 Days   I have been able to laugh and see the funny side of things.  0    I have looked forward with enjoyment to things.  0    I have blamed myself unnecessarily when things went wrong.  2    I have been anxious or worried for no good reason.  0    I have felt scared or panicky for no  good reason.  0    Things have been getting on top of me.  0    I have been so unhappy that I have had difficulty sleeping.  0    I have felt sad or miserable.  1    I have been so unhappy that I have been crying.  1    The thought of harming myself has occurred to me.  0    Edinburgh Postnatal Depression Scale Total  4      Review of Systems:   Pertinent items are noted in HPI Denies Abnormal vaginal discharge w/ itching/odor/irritation, headaches, visual changes, shortness of breath, chest pain, abdominal pain, severe nausea/vomiting, or problems with urination or bowel movements. Pertinent History Reviewed:  Reviewed past medical,surgical, obstetrical and family history.  Reviewed problem list, medications and allergies. OB History  Gravida Para Term Preterm AB Living  2 1 1   1 1   SAB TAB Ectopic Multiple Live Births  1     0 1    # Outcome Date GA Lbr Len/2nd Weight Sex Delivery Anes PTL Lv  2 Term 12/10/18 6261w1d / 01:38 8 lb 14.9 oz (4.051 kg) F Vag-Spont EPI  LIV  1 SAB 02/07/18 4252w0d          Physical Assessment:   Vitals:   01/19/19 1016  BP: 107/76  Pulse: 91  There is no height or weight on file to calculate BMI.       Physical Examination:  General:  Alert, oriented and cooperative.  Mental Status: Normal mood and affect perceived. Normal judgment and thought content.  Rest of physical exam deferred due to type of encounter       No results found for this or any previous visit (from the past 24 hour(s)).  Assessment & Plan:  1) Postpartum exam 2) 5 wks s/p SVB after IOL for GHTN 3) Breastfeeding 4) Depression screening 5) Contraception counseling, pt prefers abstinence until IUD insertion  Meds: No orders of the defined types were placed in this encounter.   I discussed the assessment and treatment plan with the patient. The patient was provided an opportunity to ask questions and all were answered. The patient agreed with the plan and demonstrated an  understanding of the instructions.   The patient was advised to call back or seek an in-person evaluation/go to the ED for any concerning postpartum symptoms.  I provided 10 minutes of face-to-face time during this encounter.  Follow-up: Return for next week for IUD insertion.   No orders of the defined types were placed in this encounter.   Cheral Marker CNM, [facility] 01/19/2019 10:36 AM

## 2019-01-19 NOTE — Patient Instructions (Addendum)
NO SEX UNTIL AFTER YOU GET YOUR BIRTH CONTROL   Tips To Increase Milk Supply  Lots of water! Enough so that your urine is clear  Plenty of calories, if you're not getting enough calories, your milk supply can decrease  Breastfeed/pump often, every 2-3 hours x 20-30mins  Fenugreek 3 pills 3 times a day, this may make your urine smell like maple syrup  Mother's Milk Tea  Lactation cookies, google for the recipe  Real oatmeal   

## 2019-01-24 ENCOUNTER — Encounter: Payer: Self-pay | Admitting: *Deleted

## 2019-01-25 ENCOUNTER — Ambulatory Visit (INDEPENDENT_AMBULATORY_CARE_PROVIDER_SITE_OTHER): Payer: Commercial Managed Care - PPO | Admitting: Women's Health

## 2019-01-25 ENCOUNTER — Encounter: Payer: Self-pay | Admitting: Women's Health

## 2019-01-25 ENCOUNTER — Other Ambulatory Visit: Payer: Self-pay

## 2019-01-25 VITALS — BP 115/65 | HR 90 | Temp 98.1°F | Ht 66.0 in | Wt 242.6 lb

## 2019-01-25 DIAGNOSIS — Z30430 Encounter for insertion of intrauterine contraceptive device: Secondary | ICD-10-CM | POA: Insufficient documentation

## 2019-01-25 DIAGNOSIS — Z3202 Encounter for pregnancy test, result negative: Secondary | ICD-10-CM | POA: Diagnosis not present

## 2019-01-25 LAB — POCT URINE PREGNANCY: Preg Test, Ur: NEGATIVE

## 2019-01-25 MED ORDER — LEVONORGESTREL 19.5 MCG/DAY IU IUD
INTRAUTERINE_SYSTEM | Freq: Once | INTRAUTERINE | Status: AC
  Administered 2019-01-25: 10:00:00 via INTRAUTERINE

## 2019-01-25 NOTE — Progress Notes (Signed)
   IUD INSERTION Patient name: Emily Carpenter MRN [id number]  Date of birth: 1991-05-28 Subjective Findings:   Emily Carpenter is a 28 y.o. G23P1011 Caucasian female being seen today for insertion of a Liletta IUD.   No LMP recorded. Last sexual intercourse was 3/27 Last pap1/28/19. Results were:  normal  The risks and benefits of the method and placement have been thouroughly reviewed with the patient and all questions were answered.  Specifically the patient is aware of failure rate of 10/998, expulsion of the IUD and of possible perforation.  The patient is aware of irregular bleeding due to the method and understands the incidence of irregular bleeding diminishes with time.  Signed copy of informed consent in chart.  Pertinent History Reviewed:   Reviewed past medical,surgical, social, obstetrical and family history.  Reviewed problem list, medications and allergies. Objective Findings & Procedure:   Vitals:   01/25/19 0842  BP: 115/65  Pulse: 90  Temp: 98.1 F (36.7 C)  Weight: 242 lb 9.6 oz (110 kg)  Height: 5\' 6"  (1.676 m)  Body mass index is 39.16 kg/m.  Results for orders placed or performed in visit on 01/25/19 (from the past 24 hour(s))  POCT urine pregnancy   Collection Time: 01/25/19  9:11 AM  Result Value Ref Range   Preg Test, Ur Negative Negative     Time out was performed.  A graves speculum was placed in the vagina.  The cervix was visualized, prepped using Betadine, and grasped with a single tooth tenaculum. The uterus was found to be anteroflexed and it sounded to 8 cm.  Liletta IUD placed per manufacturer's recommendations. The strings were trimmed to approximately 3 cm. The patient tolerated the procedure well.   Informal transvaginal sonogram was performed and the proper placement of the IUD was verified. Assessment & Plan:   1) Liletta IUD insertion The patient was given post procedure instructions, including signs and symptoms of infection and to check  for the strings after each menses or each month, and refraining from intercourse or anything in the vagina for 3 days. She was given a Liletta care card with date IUD placed, and date IUD to be removed. She is scheduled for a f/u appointment in 4 weeks.  Orders Placed This Encounter  Procedures  . POCT urine pregnancy    Return in about 4 weeks (around 02/22/2019) for F/U webex.  Cheral Marker CNM, [facility] 01/25/2019 9:11 AM

## 2019-01-25 NOTE — Patient Instructions (Signed)
 Nothing in vagina for 3 days (no sex, douching, tampons, etc...)  Check your strings once a month to make sure you can feel them, if you are not able to please let us know  If you develop a fever of 100.4 or more in the next few weeks, or if you develop severe abdominal pain, please let us know  Use a backup method of birth control, such as condoms, for 2 weeks    Intrauterine Device Insertion, Care After  This sheet gives you information about how to care for yourself after your procedure. Your health care provider may also give you more specific instructions. If you have problems or questions, contact your health care provider. What can I expect after the procedure? After the procedure, it is common to have:  Cramps and pain in the abdomen.  Light bleeding (spotting) or heavier bleeding that is like your menstrual period. This may last for up to a few days.  Lower back pain.  Dizziness.  Headaches.  Nausea. Follow these instructions at home:  Before resuming sexual activity, check to make sure that you can feel the IUD string(s). You should be able to feel the end of the string(s) below the opening of your cervix. If your IUD string is in place, you may resume sexual activity. ? If you had a hormonal IUD inserted more than 7 days after your most recent period started, you will need to use a backup method of birth control for 7 days after IUD insertion. Ask your health care provider whether this applies to you.  Continue to check that the IUD is still in place by feeling for the string(s) after every menstrual period, or once a month.  Take over-the-counter and prescription medicines only as told by your health care provider.  Do not drive or use heavy machinery while taking prescription pain medicine.  Keep all follow-up visits as told by your health care provider. This is important. Contact a health care provider if:  You have bleeding that is heavier or lasts longer than  a normal menstrual cycle.  You have a fever.  You have cramps or abdominal pain that get worse or do not get better with medicine.  You develop abdominal pain that is new or is not in the same area of earlier cramping and pain.  You feel lightheaded or weak.  You have abnormal or bad-smelling discharge from your vagina.  You have pain during sexual activity.  You have any of the following problems with your IUD string(s): ? The string bothers or hurts you or your sexual partner. ? You cannot feel the string. ? The string has gotten longer.  You can feel the IUD in your vagina.  You think you may be pregnant, or you miss your menstrual period.  You think you may have an STI (sexually transmitted infection). Get help right away if:  You have flu-like symptoms.  You have a fever and chills.  You can feel that your IUD has slipped out of place. Summary  After the procedure, it is common to have cramps and pain in the abdomen. It is also common to have light bleeding (spotting) or heavier bleeding that is like your menstrual period.  Continue to check that the IUD is still in place by feeling for the string(s) after every menstrual period, or once a month.  Keep all follow-up visits as told by your health care provider. This is important.  Contact your health care provider if   you have problems with your IUD string(s), such as the string getting longer or bothering you or your sexual partner. This information is not intended to replace advice given to you by your health care provider. Make sure you discuss any questions you have with your health care provider. Document Released: 05/19/2011 Document Revised: 08/11/2016 Document Reviewed: 08/11/2016 Elsevier Interactive Patient Education  2019 Elsevier Inc.  

## 2019-01-25 NOTE — Addendum Note (Signed)
Addended by: Federico Flake A on: 01/25/2019 09:51 AM   Modules accepted: Orders

## 2019-02-20 ENCOUNTER — Other Ambulatory Visit: Payer: Self-pay

## 2019-02-20 ENCOUNTER — Ambulatory Visit (INDEPENDENT_AMBULATORY_CARE_PROVIDER_SITE_OTHER): Payer: Commercial Managed Care - PPO | Admitting: Women's Health

## 2019-02-20 ENCOUNTER — Encounter: Payer: Self-pay | Admitting: Women's Health

## 2019-02-20 VITALS — Ht 66.0 in | Wt 235.0 lb

## 2019-02-20 DIAGNOSIS — Z30431 Encounter for routine checking of intrauterine contraceptive device: Secondary | ICD-10-CM | POA: Diagnosis not present

## 2019-02-20 NOTE — Progress Notes (Signed)
   TELEHEALTH VIRTUAL GYN VISIT ENCOUNTER NOTE Patient name: Emily Carpenter MRN [id number]  Date of birth: 1990/10/09  I connected with patient on 02/20/19 at 10:30 AM EDT by [facility] and verified that I am speaking with the correct person using two identifiers.  Due to COVID-19 recommendations, pt is not currently in the office.    I discussed the limitations, risks, security and privacy concerns of performing an evaluation and management service by telephone and the availability of in person appointments. I also discussed with the patient that there may be a patient responsible charge related to this service. The patient expressed understanding and agreed to proceed.   Chief Complaint:   Follow-up (Iud)  History of Present Illness:   ZONDA SUNDIN is a 28 y.o. G1P1011 Caucasian female being evaluated today for IUD f/u.  Liletta IUD placed 01/25/19. No problems, occ bleeding, no pain, no dyspareunia, able to feel strings. Doing well  No LMP recorded. (Menstrual status: IUD). The current method of family planning is IUD. Last pap 10/31/17. Results were:  normal Review of Systems:   Pertinent items are noted in HPI Denies fever/chills, dizziness, headaches, visual disturbances, fatigue, shortness of breath, chest pain, abdominal pain, vomiting, abnormal vaginal discharge/itching/odor/irritation, problems with periods, bowel movements, urination, or intercourse unless otherwise stated above.  Pertinent History Reviewed:  Reviewed past medical,surgical, social, obstetrical and family history.  Reviewed problem list, medications and allergies. Physical Assessment:   Vitals:   02/20/19 1114  Weight: 235 lb (106.6 kg)  Height: 5\' 6"  (1.676 m)  Body mass index is 37.93 kg/m.       Physical Examination:   General:  Alert, oriented and cooperative.   Mental Status: Normal mood and affect perceived. Normal judgment and thought content.  Physical exam deferred due to nature of the encounter  No  results found for this or any previous visit (from the past 24 hour(s)).  Assessment & Plan:  1) IUD f/u> doing well  Meds: No orders of the defined types were placed in this encounter.   No orders of the defined types were placed in this encounter.   I discussed the assessment and treatment plan with the patient. The patient was provided an opportunity to ask questions and all were answered. The patient agreed with the plan and demonstrated an understanding of the instructions.   The patient was advised to call back or seek an in-person evaluation/go to the ED if the symptoms worsen or if the condition fails to improve as anticipated.  I provided 10 minutes of non-face-to-face time during this encounter.   Return in about 1 year (around 02/20/2020) for Physical.  Cheral Marker CNM, WHNP-BC 02/20/2019 11:24 AM

## 2019-02-28 ENCOUNTER — Encounter: Payer: Self-pay | Admitting: *Deleted

## 2019-12-24 ENCOUNTER — Other Ambulatory Visit: Payer: Commercial Managed Care - PPO | Admitting: Advanced Practice Midwife

## 2020-01-01 ENCOUNTER — Other Ambulatory Visit: Payer: Self-pay

## 2020-01-01 ENCOUNTER — Encounter: Payer: Self-pay | Admitting: Women's Health

## 2020-01-01 ENCOUNTER — Ambulatory Visit (INDEPENDENT_AMBULATORY_CARE_PROVIDER_SITE_OTHER): Payer: Commercial Managed Care - PPO | Admitting: Women's Health

## 2020-01-01 VITALS — BP 119/76 | HR 82 | Ht 67.0 in | Wt 229.8 lb

## 2020-01-01 DIAGNOSIS — Z3202 Encounter for pregnancy test, result negative: Secondary | ICD-10-CM

## 2020-01-01 DIAGNOSIS — Z30011 Encounter for initial prescription of contraceptive pills: Secondary | ICD-10-CM

## 2020-01-01 DIAGNOSIS — Z30432 Encounter for removal of intrauterine contraceptive device: Secondary | ICD-10-CM

## 2020-01-01 DIAGNOSIS — Z01419 Encounter for gynecological examination (general) (routine) without abnormal findings: Secondary | ICD-10-CM | POA: Diagnosis not present

## 2020-01-01 LAB — POCT URINE PREGNANCY: Preg Test, Ur: NEGATIVE

## 2020-01-01 MED ORDER — NORGESTIMATE-ETH ESTRADIOL 0.25-35 MG-MCG PO TABS: 1.0000 | ORAL_TABLET | Freq: Every day | ORAL | 11 refills | Status: DC

## 2020-01-01 NOTE — Patient Instructions (Signed)
Oral Contraception Use Oral contraceptive pills (OCPs) are medicines that you take to prevent pregnancy. OCPs work by:  Preventing the ovaries from releasing eggs.  Thickening mucus in the lower part of the uterus (cervix), which prevents sperm from entering the uterus.  Thinning the lining of the uterus (endometrium), which prevents a fertilized egg from attaching to the endometrium. OCPs are highly effective when taken exactly as prescribed. However, OCPs do not prevent sexually transmitted infections (STIs). Safe sex practices, such as using condoms while on an OCP, can help prevent STIs. Before taking OCPs, you may have a physical exam, blood test, and Pap test. A Pap test involves taking a sample of cells from your cervix to check for cancer. Discuss with your health care provider the possible side effects of the OCP you may be prescribed. When you start an OCP, be aware that it can take 2-3 months for your body to adjust to changes in hormone levels. How to take oral contraceptive pills Follow instructions from your health care provider about how to start taking your first cycle of OCPs. Your health care provider may recommend that you:  Start the pill on day 1 of your menstrual period. If you start at this time, you will not need any backup form of birth control (contraception), such as condoms.  Start the pill on the first Sunday after your menstrual period or on the day you get your prescription. In these cases, you will need to use backup contraception for the first week.  Start the pill at any time of your cycle. ? If you take the pill within 5 days of the start of your period, you will not need a backup form of contraception. ? If you start at any other time of your menstrual cycle, you will need to use another form of contraception for 7 days. If your OCP is the type called a minipill, it will protect you from pregnancy after taking it for 2 days (48 hours), and you can stop using  backup contraception after that time. After you have started taking OCPs:  If you forget to take 1 pill, take it as soon as you remember. Take the next pill at the regular time.  If you miss 2 or more pills, call your health care provider. Different pills have different instructions for missed doses. Use backup birth control until your next menstrual period starts.  If you use a 28-day pack that contains inactive pills and you miss 1 of the last 7 pills (pills with no hormones), throw away the rest of the non-hormone pills and start a new pill pack. No matter which day you start the OCP, you will always start a new pack on that same day of the week. Have an extra pack of OCPs and a backup contraceptive method available in case you miss some pills or lose your OCP pack. Follow these instructions at home:  Do not use any products that contain nicotine or tobacco, such as cigarettes and e-cigarettes. If you need help quitting, ask your health care provider.  Always use a condom to protect against STIs. OCPs do not protect against STIs.  Use a calendar to mark the days of your menstrual period.  Read the information and directions that came with your OCP. Talk to your health care provider if you have questions. Contact a health care provider if:  You develop nausea and vomiting.  You have abnormal vaginal discharge or bleeding.  You develop a rash.    You miss your menstrual period. Depending on the type of OCP you are taking, this may be a sign of pregnancy. Ask your health care provider for more information.  You are losing your hair.  You need treatment for mood swings or depression.  You get dizzy when taking the OCP.  You develop acne after taking the OCP.  You become pregnant or think you may be pregnant.  You have diarrhea, constipation, and abdominal pain or cramps.  You miss 2 or more pills. Get help right away if:  You develop chest pain.  You develop shortness of  breath.  You have an uncontrolled or severe headache.  You develop numbness or slurred speech.  You develop visual or speech problems.  You develop pain, redness, and swelling in your legs.  You develop weakness or numbness in your arms or legs. Summary  Oral contraceptive pills (OCPs) are medicines that you take to prevent pregnancy.  OCPs do not prevent sexually transmitted infections (STIs). Always use a condom to protect against STIs.  When you start an OCP, be aware that it can take 2-3 months for your body to adjust to changes in hormone levels.  Read all the information and directions that come with your OCP. This information is not intended to replace advice given to you by your health care provider. Make sure you discuss any questions you have with your health care provider. Document Revised: 01/12/2019 Document Reviewed: 11/01/2016 Elsevier Patient Education  2020 Elsevier Inc.  

## 2020-01-01 NOTE — Progress Notes (Signed)
WELL-WOMAN EXAMINATION Patient name: Emily Carpenter MRN [id number]  Date of birth: 1991/06/10 Chief Complaint:   Gynecologic Exam (iud removal)  History of Present Illness:   Emily Carpenter is a 29 y.o. G46P1011 Caucasian female being seen today for a routine well-woman exam & IUD removal. Signed copy of informed consent in chart.  Liletta IUD placed 01/25/19. Current complaints: father, husband, coworkers have all mentioned how her mood has changed since November or so. Easily irritated/angered. Wants to take IUD out to see if helps, wants to go back to [facility]. Does not smoke, no h/o HTN, DVT/PE, CVA, MI, or migraines w/ aura.   Depression screen [facility] 2/9 01/01/2020 05/10/2018 10/31/2017 10/28/2016  Decreased Interest 0 0 0 0  Down, Depressed, Hopeless 0 0 0 0  PHQ - 2 Score 0 0 0 0  Altered sleeping 0 0 - -  Tired, decreased energy 0 0 - -  Change in appetite 0 0 - -  Feeling bad or failure about yourself  0 0 - -  Trouble concentrating 0 0 - -  Moving slowly or fidgety/restless 0 0 - -  Suicidal thoughts 0 0 - -  PHQ-9 Score 0 0 - -  Difficult doing work/chores Not difficult at all - - -     PCP: Robbie Lis      does not desire labs Patient's last menstrual period was 12/16/2019. The current method of family planning is IUD.  Last pap 10/31/17. Results were: normal. H/O abnormal pap: No Last mammogram: never. Results were: n/a. Family h/o breast cancer: Yes, MGGGGM Last colonoscopy: never. Results were: n/a. Family h/o colorectal cancer: Yes, PGGF Review of Systems:   Pertinent items are noted in HPI Denies any headaches, blurred vision, fatigue, shortness of breath, chest pain, abdominal pain, abnormal vaginal discharge/itching/odor/irritation, problems with periods, bowel movements, urination, or intercourse unless otherwise stated above. Pertinent History Reviewed:  Reviewed past medical,surgical, social and family history.  Reviewed problem list, medications and  allergies. Physical Assessment:   Vitals:   01/01/20 1451  BP: 119/76  Pulse: 82  Weight: 229 lb 12.8 oz (104.2 kg)  Height: 5\' 7"  (1.702 m)  Body mass index is 35.99 kg/m.        Physical Examination:   General appearance - well appearing, and in no distress  Mental status - alert, oriented to person, place, and time  Psych:  She has a normal mood and affect  Skin - warm and dry, normal color, no suspicious lesions noted  Chest - effort normal, all lung fields clear to auscultation bilaterally  Heart - normal rate and regular rhythm  Neck:  midline trachea, no thyromegaly or nodules  Breasts - breasts appear normal, no suspicious masses, no skin or nipple changes or  axillary nodes  Abdomen - soft, nontender, nondistended, no masses or organomegaly  IUD REMOVAL Time out was performed. A Graves speculum was placed in the vagina.  The cervix was visualized, and the strings were visible. They were grasped and the Liletta  IUD was easily removed intact without complications. The patient tolerated the procedure well.    Pelvic - VULVA: normal appearing vulva with no masses, tenderness or lesions  VAGINA: normal appearing vagina with normal color and discharge, no lesions  CERVIX: normal appearing cervix without discharge or lesions, no CMT  Thin prep pap is not done   UTERUS: uterus is felt to be normal size, shape, consistency and nontender   ADNEXA: No adnexal masses or tenderness  noted.  Extremities:  No swelling or varicosities noted   Results for orders placed or performed in visit on 01/01/20 (from the past 24 hour(s))  POCT urine pregnancy   Collection Time: 01/01/20  2:53 PM  Result Value Ref Range   Preg Test, Ur Negative Negative    Chaperone: Abby    Assessment & Plan:  1) Well-Woman Exam  2) IUD removal d/t mood swings  3) Contraception management> rx Sprintec, f/u 67mths  Labs/procedures today: IUD removal  Mammogram @29yo  or sooner if problems Colonoscopy  @29yo  or sooner if problems  Orders Placed This Encounter  Procedures  . POCT urine pregnancy    Meds:  Meds ordered this encounter  Medications  . norgestimate-ethinyl estradiol (ORTHO-CYCLEN) 0.25-35 MG-MCG tablet    Sig: Take 1 tablet by mouth daily.    Dispense:  1 Package    Refill:  11    Order Specific Question:   Supervising Provider    Answer:   Florian Buff [[id number]]    Follow-up: Return in about 3 months (around 04/02/2020) for F/U, MyChart Video, with me.  [place], [facility] 01/01/2020 3:42 PM

## 2020-04-01 ENCOUNTER — Encounter: Payer: Self-pay | Admitting: Women's Health

## 2020-04-01 ENCOUNTER — Telehealth (INDEPENDENT_AMBULATORY_CARE_PROVIDER_SITE_OTHER): Payer: Commercial Managed Care - PPO | Admitting: Women's Health

## 2020-04-01 VITALS — BP 125/82

## 2020-04-01 DIAGNOSIS — Z3041 Encounter for surveillance of contraceptive pills: Secondary | ICD-10-CM

## 2020-04-01 NOTE — Progress Notes (Signed)
   TELEHEALTH VIRTUAL GYN VISIT ENCOUNTER NOTE Patient name: Emily Carpenter MRN [id number]  Date of birth: 02-21-1991  I connected with patient on 04/01/20 at  9:30 AM EDT by MyChart video  and verified that I am speaking with the correct person using two identifiers.  Pt is not currently in the office, she is at work.  Provider is in the office.    I discussed the limitations, risks, security and privacy concerns of performing an evaluation and management service by telephone and the availability of in person appointments. I also discussed with the patient that there may be a patient responsible charge related to this service. The patient expressed understanding and agreed to proceed.   Chief Complaint:   Follow-up  History of Present Illness:   Emily Carpenter is a 29 y.o. G33P1011 Caucasian female being evaluated today for f/u on COCs rx'd 01/01/20. States she is doing great on them, feels back to her normal self. Had IUD prior and felt a lot of mood swings.     Depression screen [facility] 2/9 01/01/2020 05/10/2018 10/31/2017 10/28/2016  Decreased Interest 0 0 0 0  Down, Depressed, Hopeless 0 0 0 0  PHQ - 2 Score 0 0 0 0  Altered sleeping 0 0 - -  Tired, decreased energy 0 0 - -  Change in appetite 0 0 - -  Feeling bad or failure about yourself  0 0 - -  Trouble concentrating 0 0 - -  Moving slowly or fidgety/restless 0 0 - -  Suicidal thoughts 0 0 - -  PHQ-9 Score 0 0 - -  Difficult doing work/chores Not difficult at all - - -    No LMP recorded. (Menstrual status: IUD). The current method of family planning is OCP (estrogen/progesterone).  Last pap 10/31/17. Results were:  normal Review of Systems:   Pertinent items are noted in HPI Denies fever/chills, dizziness, headaches, visual disturbances, fatigue, shortness of breath, chest pain, abdominal pain, vomiting, abnormal vaginal discharge/itching/odor/irritation, problems with periods, bowel movements, urination, or intercourse unless  otherwise stated above.  Pertinent History Reviewed:  Reviewed past medical,surgical, social, obstetrical and family history.  Reviewed problem list, medications and allergies. Physical Assessment:   Vitals:   04/01/20 0938  BP: 125/82  There is no height or weight on file to calculate BMI.       Physical Examination:   General:  Alert, oriented and cooperative.   Mental Status: Normal mood and affect perceived. Normal judgment and thought content.  Physical exam deferred due to nature of the encounter  No results found for this or any previous visit (from the past 24 hour(s)).  Assessment & Plan:  1) Contraception management> doing great on pills, continue  Meds: No orders of the defined types were placed in this encounter.   No orders of the defined types were placed in this encounter.   I discussed the assessment and treatment plan with the patient. The patient was provided an opportunity to ask questions and all were answered. The patient agreed with the plan and demonstrated an understanding of the instructions.   The patient was advised to call back or seek an in-person evaluation/go to the ED if the symptoms worsen or if the condition fails to improve as anticipated.  I provided 10 minutes of non-face-to-face time during this encounter.   Return for April 2022 for pap & physical.  Cheral Marker CNM, [facility] 04/01/2020 9:39 AM

## 2020-07-12 IMAGING — US US OB TRANSVAGINAL
1 series · 14 of 28 positions shown · non-contrast
Comparison: None.

CLINICAL DATA: Nausea.  Positive pregnancy test.

EXAM:
TRANSVAGINAL OB ULTRASOUND
TECHNIQUE: Transvaginal ultrasound was performed for complete evaluation of the
gestation as well as the maternal uterus, adnexal regions, and
pelvic cul-de-sac.

[Series 1: us ob transvaginal · 0.11mm/px · 14 of 52 slices shown]
[im 2/52]
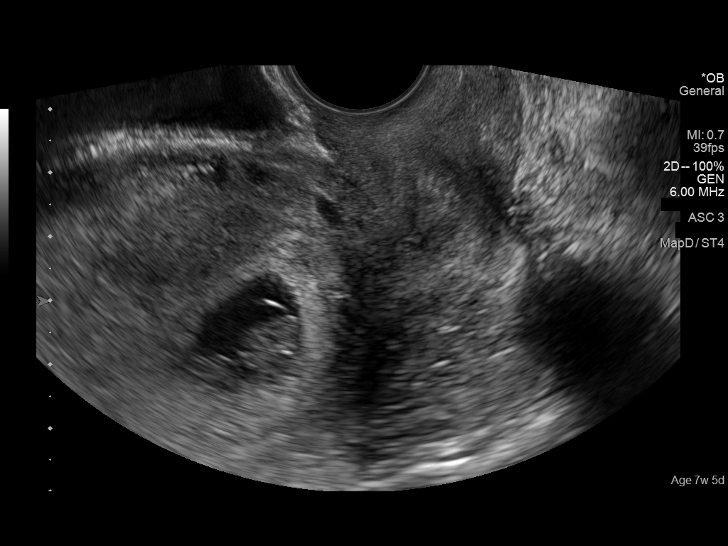
[im 6/52]
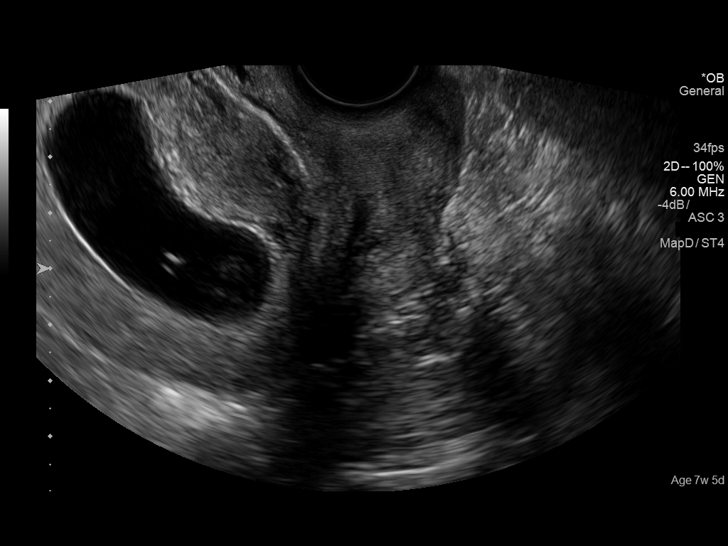
[im 10/52]
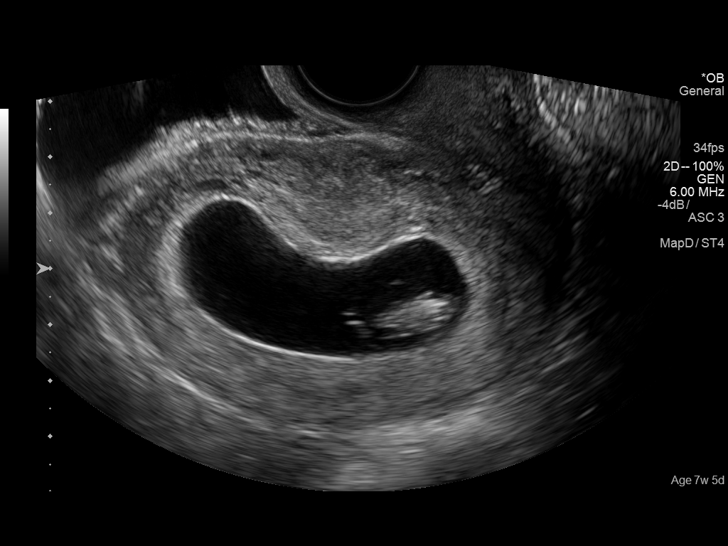
[im 14/52]
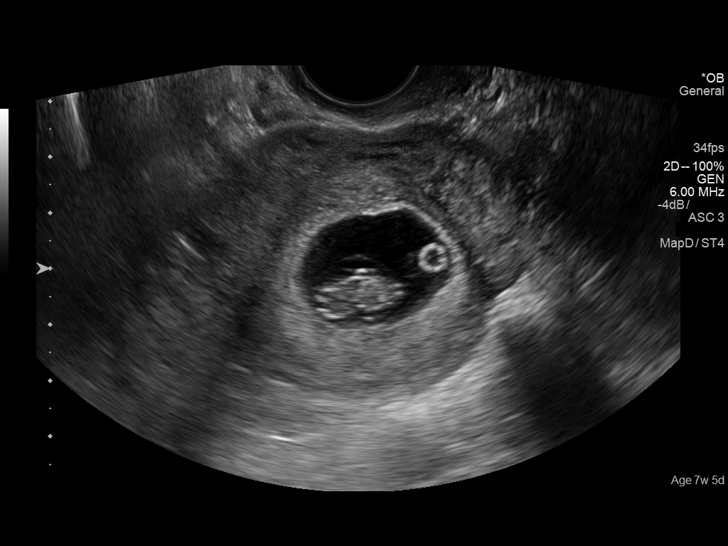
[im 18/52]
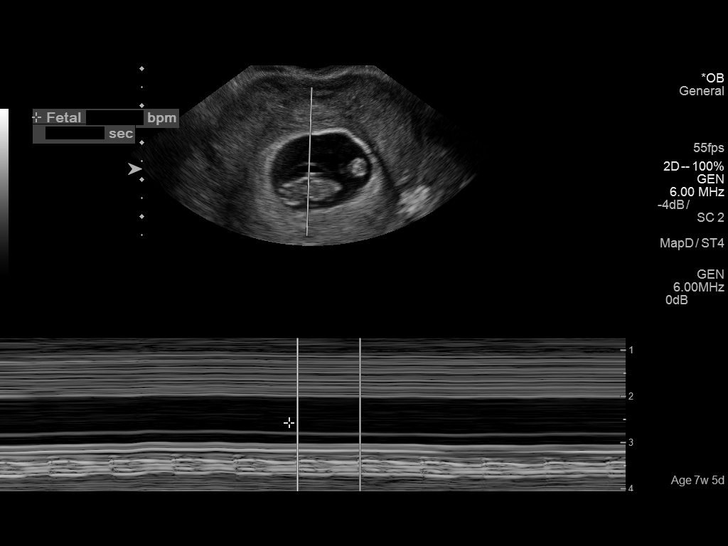
[im 21/52]
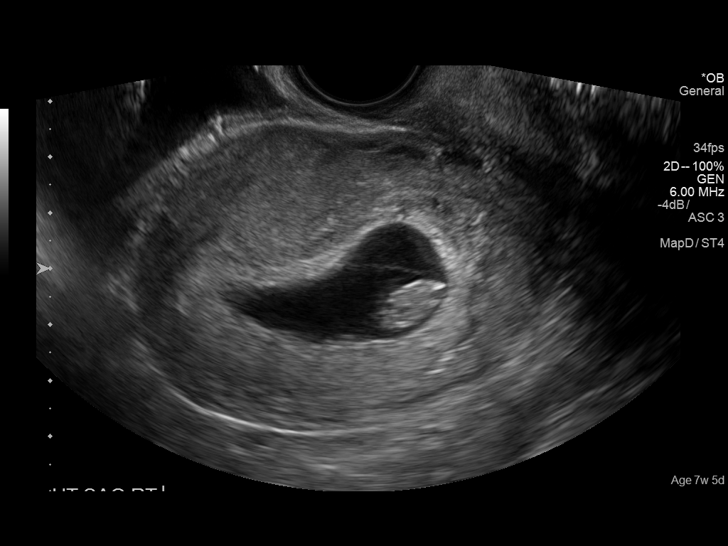
[im 25/52]
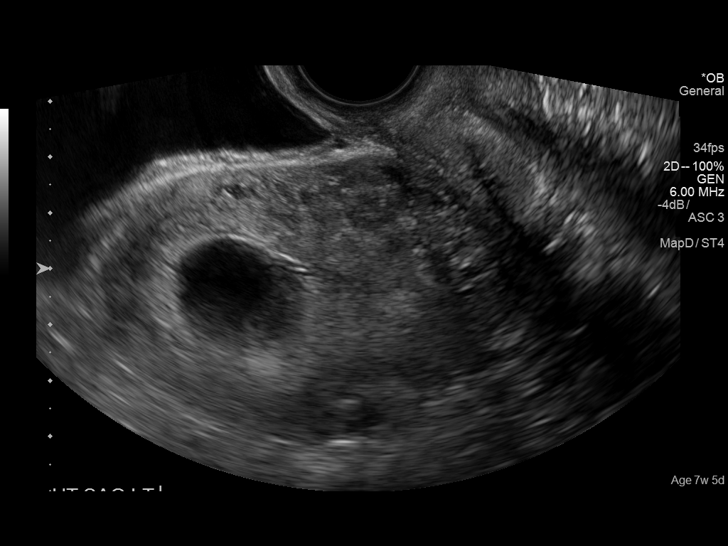
[im 29/52]
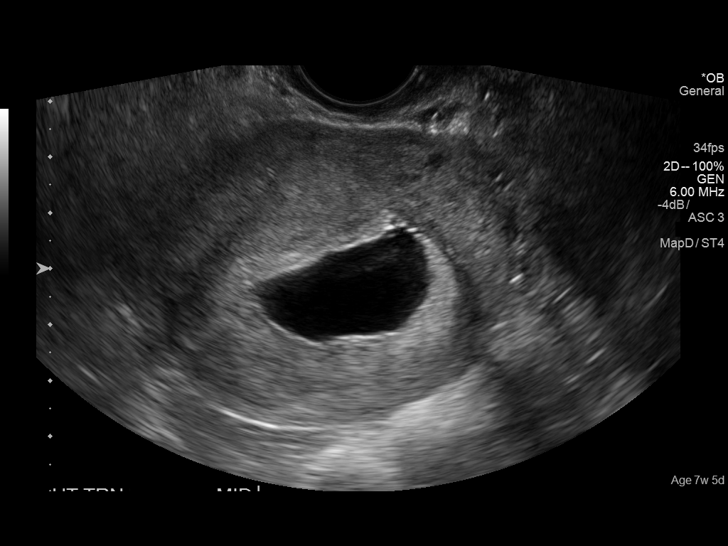
[im 33/52]
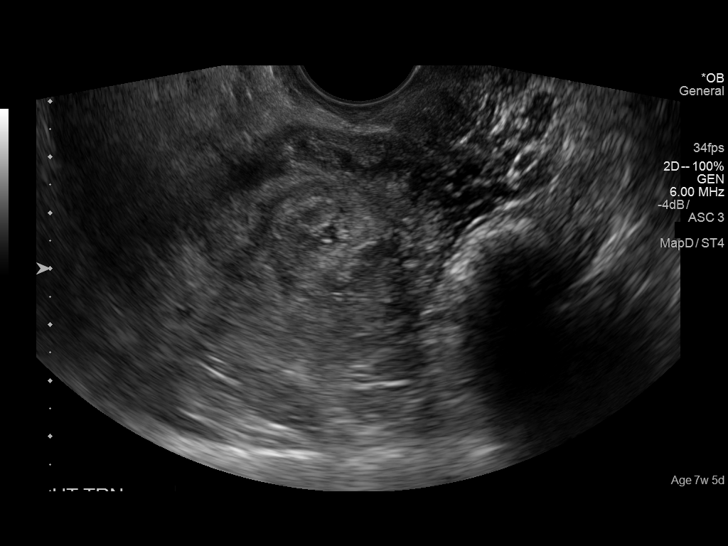
[im 36/52]
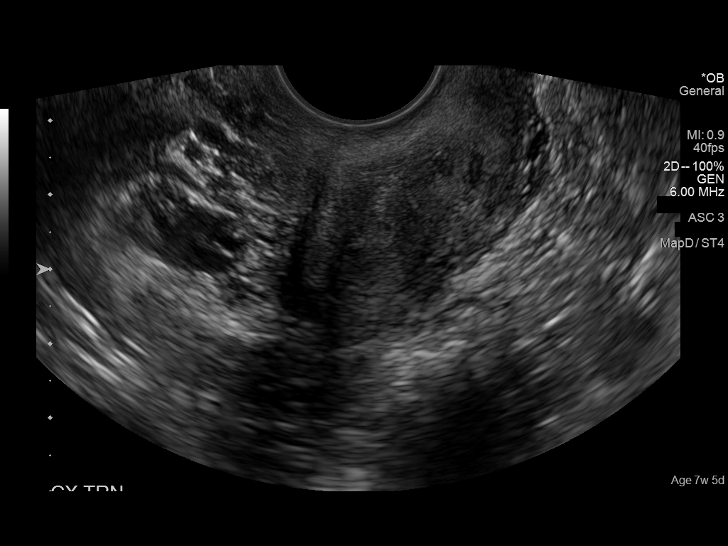
[im 40/52]
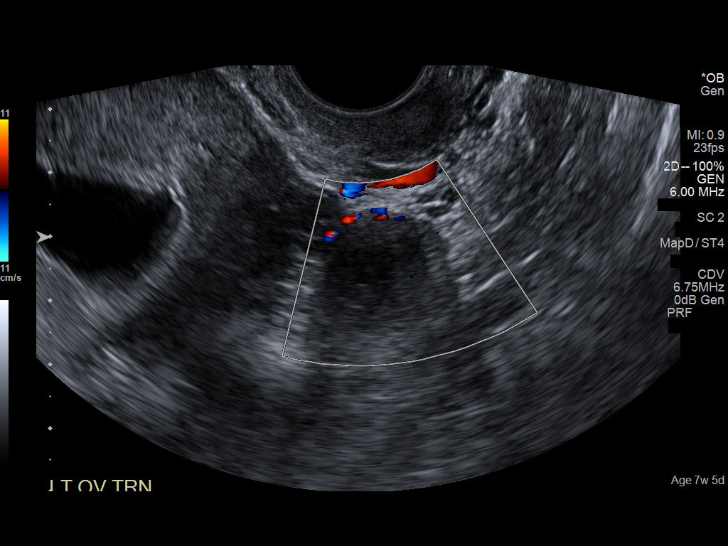
[im 44/52]
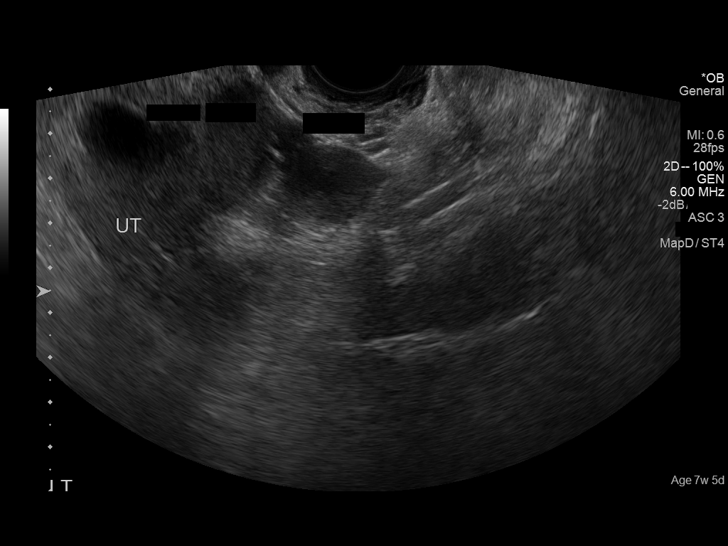
[im 48/52]
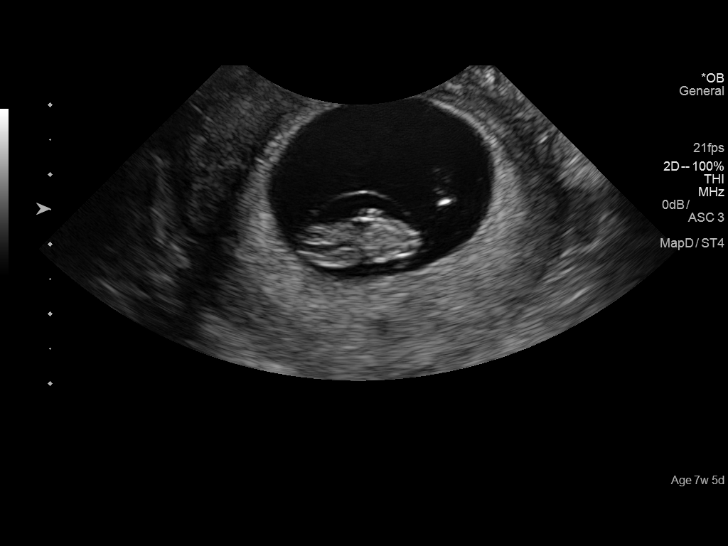
[im 52/52]
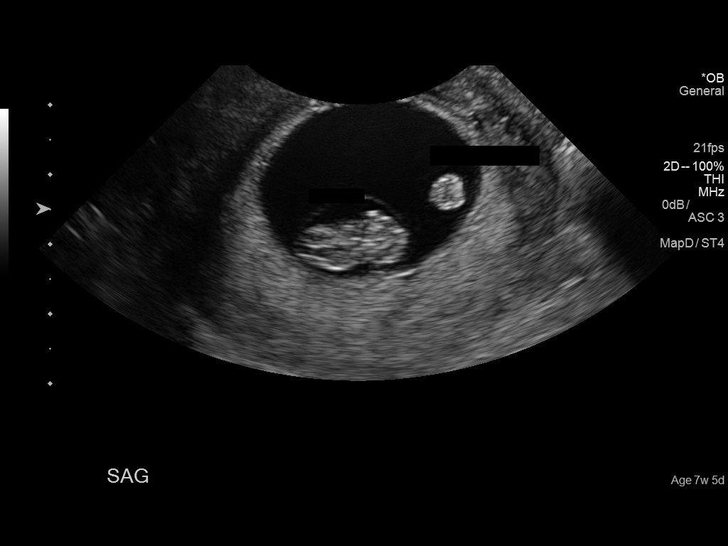

[14 of 28 positions shown; findings below may reference images not displayed]

FINDINGS: Intrauterine gestational sac: Single

Yolk sac:  Visualized.

Embryo:  Visualized.

Cardiac Activity: Visualized.

Heart Rate: 162 bpm

CRL:   16.3 mm   8 w 0 d                  US EDC: 12/07/2018

Subchorionic hemorrhage:  None visualized.

Maternal uterus/adnexae: Right ovary not visualized. Normal left
ovary. Probable left ovarian corpus luteum. No free fluid in the
pelvis.
IMPRESSION: Single live intrauterine gestation.  No subchorionic hemorrhage.

## 2020-10-08 ENCOUNTER — Other Ambulatory Visit: Payer: Self-pay | Admitting: Women's Health

## 2020-12-03 ENCOUNTER — Other Ambulatory Visit: Payer: Self-pay | Admitting: Adult Health

## 2020-12-05 ENCOUNTER — Other Ambulatory Visit: Payer: Self-pay | Admitting: Adult Health

## 2021-01-01 ENCOUNTER — Other Ambulatory Visit (HOSPITAL_COMMUNITY)
Admission: RE | Admit: 2021-01-01 | Discharge: 2021-01-01 | Disposition: A | Discharge status: Home / Self Care | Payer: BC Managed Care – PPO | Source: Ambulatory Visit | Attending: Obstetrics & Gynecology | Admitting: Obstetrics & Gynecology

## 2021-01-01 ENCOUNTER — Ambulatory Visit (INDEPENDENT_AMBULATORY_CARE_PROVIDER_SITE_OTHER): Payer: BC Managed Care – PPO | Admitting: Women's Health

## 2021-01-01 ENCOUNTER — Encounter: Payer: Self-pay | Admitting: Women's Health

## 2021-01-01 ENCOUNTER — Other Ambulatory Visit: Payer: Self-pay

## 2021-01-01 VITALS — BP 133/71 | HR 108 | Ht 67.0 in | Wt 206.0 lb

## 2021-01-01 DIAGNOSIS — Z01419 Encounter for gynecological examination (general) (routine) without abnormal findings: Secondary | ICD-10-CM | POA: Insufficient documentation

## 2021-01-01 DIAGNOSIS — N926 Irregular menstruation, unspecified: Secondary | ICD-10-CM

## 2021-01-01 LAB — POCT URINE PREGNANCY: Preg Test, Ur: POSITIVE — AB

## 2021-01-01 NOTE — Progress Notes (Signed)
WELL-WOMAN EXAMINATION Patient name: Emily Carpenter MRN [id number]  Date of birth: [birth year] Chief Complaint:   Gynecologic Exam  History of Present Illness:   Emily Carpenter is a 30 y.o. G68P1011 Caucasian female being seen today for a routine well-woman exam.  Current complaints: ran out of COCs ago, has been having unprotected sex, LMP 2/27. No n/v, breast tenderness.   Depression screen [facility] 2/9 01/01/2021 01/01/2020 05/10/2018 10/31/2017 10/28/2016  Decreased Interest 0 0 0 0 0  Down, Depressed, Hopeless 0 0 0 0 0  PHQ - 2 Score 0 0 0 0 0  Altered sleeping 0 0 0 - -  Tired, decreased energy 0 0 0 - -  Change in appetite 0 0 0 - -  Feeling bad or failure about yourself  0 0 0 - -  Trouble concentrating 0 0 0 - -  Moving slowly or fidgety/restless 0 0 0 - -  Suicidal thoughts 0 0 0 - -  PHQ-9 Score 0 0 0 - -  Difficult doing work/chores - Not difficult at all - - -     PCP: Robbie Lis      does not desire labs Patient's last menstrual period was 11/30/2020 (exact date). The current method of family planning is none.  Last pap 10/31/17. Results were: NILM w/ HRHPV negative. H/O abnormal pap: no Last mammogram: never. Results were: N/A. Family h/o breast cancer: no Last colonoscopy: never. Results were: N/A. Family h/o colorectal cancer: yes distant Review of Systems:   Pertinent items are noted in HPI Denies any headaches, blurred vision, fatigue, shortness of breath, chest pain, abdominal pain, abnormal vaginal discharge/itching/odor/irritation, problems with periods, bowel movements, urination, or intercourse unless otherwise stated above. Pertinent History Reviewed:  Reviewed past medical,surgical, social and family history.  Reviewed problem list, medications and allergies. Physical Assessment:   Vitals:   01/01/21 1613  BP: 133/71  Pulse: (!) 108  Weight: 206 lb (93.4 kg)  Height: 5\' 7"  (1.702 m)  Body mass index is 32.26 kg/m.        Physical Examination:    General appearance - well appearing, and in no distress  Mental status - alert, oriented to person, place, and time  Psych:  She has a normal mood and affect  Skin - warm and dry, normal color, no suspicious lesions noted  Chest - effort normal, all lung fields clear to auscultation bilaterally  Heart - normal rate and regular rhythm  Neck:  midline trachea, no thyromegaly or nodules  Breasts - breasts appear normal, no suspicious masses, no skin or nipple changes or  axillary nodes  Abdomen - soft, nontender, nondistended, no masses or organomegaly  Pelvic - VULVA: normal appearing vulva with no masses, tenderness or lesions  VAGINA: normal appearing vagina with normal color and discharge, no lesions  CERVIX: normal appearing cervix without discharge or lesions, no CMT  Thin prep pap is done w/ HR HPV cotesting  UTERUS: uterus is felt to be normal size, shape, consistency and nontender   ADNEXA: No adnexal masses or tenderness noted.  Extremities:  No swelling or varicosities noted  Chaperone:    Results for orders placed or performed in visit on 01/01/21 (from the past 24 hour(s))  POCT urine pregnancy   Collection Time: 01/01/21  4:58 PM  Result Value Ref Range   Preg Test, Ur Positive (A) Negative    Assessment & Plan:  1) Well-Woman Exam  2) Late period> w/ faint +UPT,  will get HCG. Start pnv  Labs/procedures today: pap, hcg  Mammogram: @ 28yo, or sooner if problems Colonoscopy: @ 28yo, or sooner if problems  Orders Placed This Encounter  Procedures  . Beta hCG quant (ref lab)  . POCT urine pregnancy    Meds: No orders of the defined types were placed in this encounter.   Follow-up: Return in about 1 year (around 01/01/2022) for Physical.  Cheral Marker CNM, [facility] 01/01/2021 5:02 PM

## 2021-01-02 LAB — BETA HCG QUANT (REF LAB): hCG Quant: 355 m[IU]/mL

## 2021-01-06 LAB — CYTOLOGY - PAP
Comment: NEGATIVE
Diagnosis: NEGATIVE
High risk HPV: NEGATIVE

## 2021-01-12 ENCOUNTER — Telehealth: Payer: Self-pay

## 2021-01-12 ENCOUNTER — Telehealth: Payer: Self-pay | Admitting: Women's Health

## 2021-01-12 NOTE — Telephone Encounter (Signed)
Patient called to report spotting when wiping. Clinical staff was informed and will try to get patient on the schedule to see a provider.

## 2021-01-12 NOTE — Telephone Encounter (Signed)
Patient is [redacted] weeks pregnant. She called reporting brownish discharge that has begun to turn light pink that began this morning. She is wearing a pad, but only sees a small amount when wiping. No cramping and no sex within the past 48 hours. Pt has a hx of miscarriage and is concerned. Please advise.

## 2021-01-13 ENCOUNTER — Other Ambulatory Visit: Payer: Self-pay | Admitting: Women's Health

## 2021-01-13 DIAGNOSIS — O209 Hemorrhage in early pregnancy, unspecified: Secondary | ICD-10-CM

## 2021-01-14 ENCOUNTER — Other Ambulatory Visit: Payer: Self-pay | Admitting: Women's Health

## 2021-01-14 DIAGNOSIS — O039 Complete or unspecified spontaneous abortion without complication: Secondary | ICD-10-CM

## 2021-01-14 LAB — PROGESTERONE: Progesterone: 4 ng/mL

## 2021-01-14 LAB — BETA HCG QUANT (REF LAB): hCG Quant: 43 m[IU]/mL

## 2021-01-28 ENCOUNTER — Other Ambulatory Visit: Payer: BC Managed Care – PPO

## 2021-01-29 ENCOUNTER — Other Ambulatory Visit: Payer: Self-pay | Admitting: Women's Health

## 2021-01-29 LAB — BETA HCG QUANT (REF LAB): hCG Quant: 2 m[IU]/mL

## 2021-01-29 MED ORDER — LO LOESTRIN FE 1 MG-10 MCG / 10 MCG PO TABS: 1.0000 | ORAL_TABLET | Freq: Every day | ORAL | 3 refills | Status: AC

## 2024-03-20 ENCOUNTER — Ambulatory Visit: Admitting: Women's Health

## 2024-04-02 ENCOUNTER — Ambulatory Visit (INDEPENDENT_AMBULATORY_CARE_PROVIDER_SITE_OTHER): Admitting: Women's Health

## 2024-04-02 ENCOUNTER — Other Ambulatory Visit (HOSPITAL_COMMUNITY)
Admission: RE | Admit: 2024-04-02 | Discharge: 2024-04-02 | Disposition: A | Discharge status: Home / Self Care | Source: Ambulatory Visit | Attending: Women's Health | Admitting: Women's Health

## 2024-04-02 ENCOUNTER — Encounter: Payer: Self-pay | Admitting: Women's Health

## 2024-04-02 VITALS — BP 120/76 | HR 80 | Ht 67.0 in | Wt 196.0 lb

## 2024-04-02 DIAGNOSIS — Z8041 Family history of malignant neoplasm of ovary: Secondary | ICD-10-CM | POA: Insufficient documentation

## 2024-04-02 DIAGNOSIS — Z01419 Encounter for gynecological examination (general) (routine) without abnormal findings: Secondary | ICD-10-CM

## 2024-04-02 NOTE — Progress Notes (Signed)
 WELL-WOMAN EXAMINATION Patient name: Emily Carpenter MRN [id number]  Date of birth: 29-Jun-1991 Chief Complaint:   Gynecologic Exam (Pap 12-22-20 normal)  History of Present Illness:   Emily Carpenter is a 33 y.o. G28P1011 Caucasian female being seen today for a routine well-woman exam.  Current complaints: dad dx w/ b cell lymphoma, mom dx w/ ovarian cancer (neg genetic testing) in last few years. Has been on a weight loss journey since January, eating better, portion control, walking! Periods regular, normal flow  PCP: Pamala      does not desire labs Patient's last menstrual period was 03/12/2024. The current method of family planning is vasectomy.  Last pap 01/01/21. Results were: NILM w/ HRHPV negative. H/O abnormal pap: no Last mammogram: never. Results were: N/A. Family h/o breast cancer: no Last colonoscopy: never. Results were: N/A. Family h/o colorectal cancer: yes MU in 40s     04/02/2024    8:46 AM 01/01/2021    4:09 PM 01/01/2020    2:53 PM 05/10/2018    9:39 AM 10/31/2017    3:36 PM  Depression screen PHQ 2/9  Decreased Interest 0 0 0 0 0  Down, Depressed, Hopeless 0 0 0 0 0  PHQ - 2 Score 0 0 0 0 0  Altered sleeping 0 0 0 0   Tired, decreased energy 0 0 0 0   Change in appetite 0 0 0 0   Feeling bad or failure about yourself  0 0 0 0   Trouble concentrating 0 0 0 0   Moving slowly or fidgety/restless 0 0 0 0   Suicidal thoughts 0 0 0 0   PHQ-9 Score 0 0 0 0   Difficult doing work/chores   Not difficult at all          04/02/2024    8:46 AM 01/01/2021    4:09 PM 01/01/2020    2:54 PM  GAD 7 : Generalized Anxiety Score  Nervous, Anxious, on Edge 0 0 0  Control/stop worrying 0 0 0  Worry too much - different things 0 0 0  Trouble relaxing 0 0 0  Restless 0 0 0  Easily annoyed or irritable 0 1 3  Afraid - awful might happen 0 0 0  Total GAD 7 Score 0 1 3  Anxiety Difficulty   Not difficult at all     Review of Systems:   Pertinent items are noted in  HPI Denies any headaches, blurred vision, fatigue, shortness of breath, chest pain, abdominal pain, abnormal vaginal discharge/itching/odor/irritation, problems with periods, bowel movements, urination, or intercourse unless otherwise stated above. Pertinent History Reviewed:  Reviewed past medical,surgical, social and family history.  Reviewed problem list, medications and allergies. Physical Assessment:   Vitals:   04/02/24 0841  BP: 120/76  Pulse: 80  Weight: 196 lb (88.9 kg)  Height: 5' 7 (1.702 m)  Body mass index is 30.7 kg/m.        Physical Examination:   General appearance - well appearing, and in no distress/  Mental status - alert, oriented to person, place, and time  Psych:  She has a normal mood and affect  Skin - warm and dry, normal color, no suspicious lesions noted  Chest - effort normal, all lung fields clear to auscultation bilaterally  Heart - normal rate and regular rhythm  Neck:  midline trachea, no thyromegaly or nodules  Breasts - breasts appear normal, no suspicious masses, no skin or nipple changes or  axillary  nodes  Abdomen - soft, nontender, nondistended, no masses or organomegaly  Pelvic - VULVA: normal appearing vulva with no masses, tenderness or lesions  VAGINA: normal appearing vagina with normal color and discharge, no lesions  CERVIX: normal appearing cervix without discharge or lesions, no CMT  Thin prep pap is done w/ HR HPV cotesting  UTERUS: uterus is felt to be normal size, shape, consistency and nontender   ADNEXA: No adnexal masses or tenderness noted.  Extremities:  No swelling or varicosities noted  Chaperone: Peggy Dones  No results found for this or any previous visit (from the past 24 hours).  Assessment & Plan:  1) Well-Woman Exam  2) Family hx ovarian cancer (mother)> neg genetic testing in mom  Labs/procedures today: pap  Mammogram: @ 31yo, or sooner if problems Colonoscopy: @ 31yo, or sooner if problems  No orders of  the defined types were placed in this encounter.   Meds: No orders of the defined types were placed in this encounter.   Follow-up: Return in about 1 year (around 04/02/2025) for Physical.  Suzen JONELLE Fetters CNM, [facility] 04/02/2024 9:13 AM

## 2024-04-02 NOTE — Addendum Note (Signed)
 Addended by: SANNA GONG A on: 04/02/2024 09:39 AM   Modules accepted: Orders

## 2024-04-04 ENCOUNTER — Ambulatory Visit: Payer: Self-pay | Admitting: Women's Health

## 2024-04-04 LAB — CYTOLOGY - PAP
Comment: NEGATIVE
Diagnosis: NEGATIVE
Diagnosis: REACTIVE
High risk HPV: NEGATIVE
